# Patient Record
Sex: Male | Born: 1975 | Race: Black or African American | Hispanic: No | Marital: Single | Smoking: Current every day smoker
Health system: Southern US, Community
[De-identification: ages and names within clinical notes are randomized; demographics above are authoritative.]

## PROBLEM LIST (undated history)

## (undated) DIAGNOSIS — J45909 Unspecified asthma, uncomplicated: Secondary | ICD-10-CM

---

## 2000-01-17 ENCOUNTER — Emergency Department (HOSPITAL_COMMUNITY): Admission: EM | Admit: 2000-01-17 | Discharge: 2000-01-17 | Payer: Self-pay | Admitting: Emergency Medicine

## 2000-03-14 ENCOUNTER — Encounter: Payer: Self-pay | Admitting: Emergency Medicine

## 2000-03-14 ENCOUNTER — Emergency Department (HOSPITAL_COMMUNITY): Admission: EM | Admit: 2000-03-14 | Discharge: 2000-03-14 | Payer: Self-pay | Admitting: Emergency Medicine

## 2001-12-24 ENCOUNTER — Emergency Department (HOSPITAL_COMMUNITY): Admission: EM | Admit: 2001-12-24 | Discharge: 2001-12-24 | Payer: Self-pay

## 2001-12-24 ENCOUNTER — Encounter: Payer: Self-pay | Admitting: Emergency Medicine

## 2002-09-27 ENCOUNTER — Emergency Department (HOSPITAL_COMMUNITY): Admission: EM | Admit: 2002-09-27 | Discharge: 2002-09-27 | Payer: Self-pay | Admitting: Podiatry

## 2003-03-10 ENCOUNTER — Emergency Department (HOSPITAL_COMMUNITY): Admission: EM | Admit: 2003-03-10 | Discharge: 2003-03-10 | Payer: Self-pay | Admitting: Emergency Medicine

## 2003-06-18 ENCOUNTER — Emergency Department (HOSPITAL_COMMUNITY): Admission: EM | Admit: 2003-06-18 | Discharge: 2003-06-18 | Payer: Self-pay | Admitting: Emergency Medicine

## 2003-06-22 ENCOUNTER — Emergency Department (HOSPITAL_COMMUNITY): Admission: EM | Admit: 2003-06-22 | Discharge: 2003-06-22 | Payer: Self-pay | Admitting: Emergency Medicine

## 2003-06-23 ENCOUNTER — Emergency Department (HOSPITAL_COMMUNITY): Admission: EM | Admit: 2003-06-23 | Discharge: 2003-06-23 | Payer: Self-pay | Admitting: Emergency Medicine

## 2005-04-10 ENCOUNTER — Emergency Department (HOSPITAL_COMMUNITY): Admission: EM | Admit: 2005-04-10 | Discharge: 2005-04-10 | Payer: Self-pay | Admitting: Emergency Medicine

## 2006-01-17 ENCOUNTER — Emergency Department (HOSPITAL_COMMUNITY): Admission: EM | Admit: 2006-01-17 | Discharge: 2006-01-17 | Payer: Self-pay | Admitting: Emergency Medicine

## 2006-02-12 ENCOUNTER — Emergency Department (HOSPITAL_COMMUNITY): Admission: EM | Admit: 2006-02-12 | Discharge: 2006-02-13 | Payer: Self-pay | Admitting: Emergency Medicine

## 2007-09-22 ENCOUNTER — Emergency Department (HOSPITAL_COMMUNITY): Admission: EM | Admit: 2007-09-22 | Discharge: 2007-09-22 | Payer: Self-pay | Admitting: Emergency Medicine

## 2009-01-15 ENCOUNTER — Emergency Department (HOSPITAL_COMMUNITY): Admission: EM | Admit: 2009-01-15 | Discharge: 2009-01-15 | Payer: Self-pay | Admitting: Emergency Medicine

## 2009-10-05 ENCOUNTER — Emergency Department (HOSPITAL_COMMUNITY): Admission: EM | Admit: 2009-10-05 | Discharge: 2009-10-05 | Payer: Self-pay | Admitting: Emergency Medicine

## 2009-11-18 ENCOUNTER — Emergency Department (HOSPITAL_COMMUNITY): Admission: EM | Admit: 2009-11-18 | Discharge: 2009-11-18 | Payer: Self-pay | Admitting: Emergency Medicine

## 2010-01-07 ENCOUNTER — Emergency Department (HOSPITAL_COMMUNITY): Admission: EM | Admit: 2010-01-07 | Discharge: 2010-01-07 | Payer: Self-pay | Admitting: Family Medicine

## 2011-05-11 NOTE — H&P (Signed)
[facility]  Patient:    Jeremiah Curry                         MRN: [id number] Adm. Date:  [id number] Disc. Date: [id number] Attending:  Annamarie Dawley                         History and Physical  HISTORY OF PRESENT ILLNESS:  This patient is a 35 year old black male who has sustained an injury to his hand while dunking a basketball.  He got his hand caught on a chain link.  Subsequent to that, the patient noted a large laceration at the right index finger.  This is jagged, painful, and has fairly significant bleeding. The patient wrapped the finger and went to the emergency room.  We have seen him here.  I have been asked to see by the emergency room staff.  The patient has been given a tetanus shot.  PAST MEDICAL HISTORY:  None.  PAST SURGICAL HISTORY:  None.  MEDICATIONS:  None.  ALLERGIES:  None.  SOCIAL HISTORY:  Patient smokes a pack per day.  He does not drink.  PHYSICAL EXAMINATION:  Black male alert and oriented, in no acute distress. Patient has normal 2 point discrimination at the tip caliper measurement. There is normal capillary refill.  FDP and FDS in intact.  He has a very stellate and jagged laceration about the volar PIP region of the right index finger.  The remainder of the patients exam about the middle ring and small fingers, as well as the thumb is normal.  There is no postop bleeding at the present time.   X-rays are negative.  IMPRESSION:  Jagged, deep laceration to the right index finger.  PLAN:  I verbally consented him for I&D and repair of structures as necessary.   PROCEDURE:  The patient was given a digital block with 1% lidocaine by myself; his was done through the vincula sheath system.  After appropriate anesthesia, The patient underwent ______ surgical bath ______ , followed by sterile draping. Under 4.0 magnification, I explored the wound.  The patients ulnar digital nerve was exposed and noted  to be in the field.  It was intact.  The flexor tendon sheath was not encroached upon and the FDP and FDS were intact.  The radial digitial nerve artery were intact as well.  The patient underwent an excisional debridement, copious irrigation, and closure of the wound with interrupted 4-0 Prolene, as well as 4-0-0 chromic suture.  He tolerated this well.  Following this, sterile dressing was placed.   Tourniquet was deflated.  Tourniquet time was very brief. Excellent refill was noted.  The patient had a compressive dressing with a volar splint placed.  Excellent refill was noted at the time of discharge.  He tolerated the pro well without difficulty.  All counts were correct.  There were no complications. will have him seen in the office in 10-11 days.  He will remove his dressing in  five days and place a Band-Aid over it.  It has been a pleasure to see him.  He was given Keflex, as well as Vicodin.  He will take Keflex 500 mg 1 p.o. q.i.d. x 7  days and Vicodin 1-2 q. 4-6 p.r.n. pain p.o. dispensed #20, no refill.   If there are any problems, questions, or concerns, he will notify me. DD:  03/14/00 TD:  03/14/00 Job: [id number]

## 2013-02-13 ENCOUNTER — Encounter (HOSPITAL_COMMUNITY): Payer: Self-pay | Admitting: Cardiology

## 2013-02-13 ENCOUNTER — Emergency Department (HOSPITAL_COMMUNITY): Payer: No Typology Code available for payment source

## 2013-02-13 ENCOUNTER — Emergency Department (HOSPITAL_COMMUNITY)
Admission: EM | Admit: 2013-02-13 | Discharge: 2013-02-13 | Disposition: A | Discharge status: Home / Self Care | Payer: No Typology Code available for payment source | Attending: Emergency Medicine | Admitting: Emergency Medicine

## 2013-02-13 DIAGNOSIS — S51009A Unspecified open wound of unspecified elbow, initial encounter: Secondary | ICD-10-CM | POA: Insufficient documentation

## 2013-02-13 DIAGNOSIS — Y9389 Activity, other specified: Secondary | ICD-10-CM | POA: Insufficient documentation

## 2013-02-13 DIAGNOSIS — F17200 Nicotine dependence, unspecified, uncomplicated: Secondary | ICD-10-CM | POA: Insufficient documentation

## 2013-02-13 DIAGNOSIS — S51809A Unspecified open wound of unspecified forearm, initial encounter: Secondary | ICD-10-CM | POA: Insufficient documentation

## 2013-02-13 DIAGNOSIS — S239XXA Sprain of unspecified parts of thorax, initial encounter: Secondary | ICD-10-CM | POA: Insufficient documentation

## 2013-02-13 DIAGNOSIS — S29019A Strain of muscle and tendon of unspecified wall of thorax, initial encounter: Secondary | ICD-10-CM

## 2013-02-13 DIAGNOSIS — S161XXA Strain of muscle, fascia and tendon at neck level, initial encounter: Secondary | ICD-10-CM

## 2013-02-13 DIAGNOSIS — M549 Dorsalgia, unspecified: Secondary | ICD-10-CM

## 2013-02-13 DIAGNOSIS — Y92410 Unspecified street and highway as the place of occurrence of the external cause: Secondary | ICD-10-CM | POA: Insufficient documentation

## 2013-02-13 DIAGNOSIS — S46909A Unspecified injury of unspecified muscle, fascia and tendon at shoulder and upper arm level, unspecified arm, initial encounter: Secondary | ICD-10-CM | POA: Insufficient documentation

## 2013-02-13 DIAGNOSIS — S139XXA Sprain of joints and ligaments of unspecified parts of neck, initial encounter: Secondary | ICD-10-CM | POA: Insufficient documentation

## 2013-02-13 DIAGNOSIS — S4980XA Other specified injuries of shoulder and upper arm, unspecified arm, initial encounter: Secondary | ICD-10-CM | POA: Insufficient documentation

## 2013-02-13 DIAGNOSIS — Z23 Encounter for immunization: Secondary | ICD-10-CM | POA: Insufficient documentation

## 2013-02-13 DIAGNOSIS — S298XXA Other specified injuries of thorax, initial encounter: Secondary | ICD-10-CM | POA: Insufficient documentation

## 2013-02-13 MED ORDER — TETANUS-DIPHTH-ACELL PERTUSSIS 5-2.5-18.5 LF-MCG/0.5 IM SUSP
0.5000 mL | Freq: Once | INTRAMUSCULAR | Status: AC
  Administered 2013-02-13: 0.5 mL via INTRAMUSCULAR
  Filled 2013-02-13: qty 0.5

## 2013-02-13 MED ORDER — DIAZEPAM 5 MG PO TABS
10.0000 mg | ORAL_TABLET | Freq: Once | ORAL | Status: AC
  Administered 2013-02-13: 10 mg via ORAL
  Filled 2013-02-13: qty 2

## 2013-02-13 MED ORDER — NAPROXEN 500 MG PO TABS: 500.0000 mg | ORAL_TABLET | Freq: Two times a day (BID) | ORAL | Status: DC | PRN

## 2013-02-13 MED ORDER — HYDROCODONE-ACETAMINOPHEN 5-325 MG PO TABS: 1.0000 | ORAL_TABLET | Freq: Four times a day (QID) | ORAL | Status: DC | PRN

## 2013-02-13 MED ORDER — HYDROCODONE-ACETAMINOPHEN 5-325 MG PO TABS
2.0000 | ORAL_TABLET | Freq: Once | ORAL | Status: AC
  Administered 2013-02-13: 2 via ORAL
  Filled 2013-02-13: qty 2

## 2013-02-13 MED ORDER — METHOCARBAMOL 750 MG PO TABS: 750.0000 mg | ORAL_TABLET | Freq: Four times a day (QID) | ORAL | Status: DC | PRN
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
## 2013-02-13 NOTE — ED Notes (Signed)
Patient transported to X-ray 

## 2013-02-14 NOTE — ED Provider Notes (Signed)
Medical screening examination/treatment/procedure(s) were performed by non-physician practitioner and as supervising physician I was immediately available for consultation/collaboration.   [name], MD 02/14/13 [phone]

## 2013-04-08 ENCOUNTER — Emergency Department (HOSPITAL_COMMUNITY)
Admission: EM | Admit: 2013-04-08 | Discharge: 2013-04-09 | Disposition: A | Discharge status: Home / Self Care | Payer: Self-pay | Attending: Emergency Medicine | Admitting: Emergency Medicine

## 2013-04-08 ENCOUNTER — Encounter (HOSPITAL_COMMUNITY): Payer: Self-pay | Admitting: Emergency Medicine

## 2013-04-08 DIAGNOSIS — R300 Dysuria: Secondary | ICD-10-CM | POA: Insufficient documentation

## 2013-04-08 DIAGNOSIS — Z7251 High risk heterosexual behavior: Secondary | ICD-10-CM | POA: Insufficient documentation

## 2013-04-08 DIAGNOSIS — A5619 Other chlamydial genitourinary infection: Secondary | ICD-10-CM | POA: Insufficient documentation

## 2013-04-08 DIAGNOSIS — A64 Unspecified sexually transmitted disease: Secondary | ICD-10-CM

## 2013-04-08 DIAGNOSIS — F17200 Nicotine dependence, unspecified, uncomplicated: Secondary | ICD-10-CM | POA: Insufficient documentation

## 2013-04-08 DIAGNOSIS — I10 Essential (primary) hypertension: Secondary | ICD-10-CM | POA: Insufficient documentation

## 2013-04-08 NOTE — ED Notes (Signed)
PT. REPORTS PENILE DISCHARGE WITH DYSURIA FOR 2 DAYS , DENIES HEMATURIA.

## 2013-04-09 LAB — URINALYSIS, ROUTINE W REFLEX MICROSCOPIC
Glucose, UA: NEGATIVE mg/dL
Specific Gravity, Urine: 1.017 (ref 1.005–1.030)
pH: 5.5 (ref 5.0–8.0)

## 2013-04-09 LAB — URINE MICROSCOPIC-ADD ON

## 2013-04-09 MED ORDER — AZITHROMYCIN 250 MG PO TABS
1000.0000 mg | ORAL_TABLET | Freq: Once | ORAL | Status: AC
  Administered 2013-04-09: 1000 mg via ORAL
  Filled 2013-04-09: qty 4

## 2013-04-09 MED ORDER — LIDOCAINE HCL (PF) 1 % IJ SOLN
INTRAMUSCULAR | Status: AC
  Administered 2013-04-09: 5 mL
  Filled 2013-04-09: qty 5

## 2013-04-09 MED ORDER — CEFTRIAXONE SODIUM 250 MG IJ SOLR
250.0000 mg | Freq: Once | INTRAMUSCULAR | Status: AC
  Administered 2013-04-09: 250 mg via INTRAMUSCULAR
  Filled 2013-04-09: qty 250

## 2013-04-09 NOTE — ED Provider Notes (Signed)
Medical screening examination/treatment/procedure(s) were performed by non-physician practitioner and as supervising physician I was immediately available for consultation/collaboration.  Kiaraliz Rafuse M Iverna Hammac, MD 04/09/13 0644 

## 2013-04-09 NOTE — ED Provider Notes (Signed)
History     CSN: [id number]  Arrival date & time 04/08/13  2140   First MD Initiated Contact with Patient 04/09/13 0020      Chief Complaint  Patient presents with  . Penile Discharge    (Consider location/radiation/quality/duration/timing/severity/associated sxs/prior treatment) Patient is a 37 y.o. male presenting with penile discharge. The history is provided by the patient.  Penile Discharge The current episode started yesterday. The problem occurs intermittently. The problem has been gradually worsening. Pertinent negatives include no abdominal pain, anorexia, arthralgias, change in bowel habit, chest pain, chills, congestion, coughing, diaphoresis, fatigue, fever, headaches, joint swelling, myalgias, nausea, neck pain, numbness, rash, sore throat, swollen glands, urinary symptoms, vertigo, visual change, vomiting or weakness. Nothing aggravates the symptoms. He has tried nothing for the symptoms.    History reviewed. No pertinent past medical history.  History reviewed. No pertinent past surgical history.  No family history on file.  History  Substance Use Topics  . Smoking status: Current Every Day Smoker  . Smokeless tobacco: Not on file  . Alcohol Use: Yes      Review of Systems  Constitutional: Negative for fever, chills, diaphoresis and fatigue.  HENT: Negative for congestion, sore throat and neck pain.   Respiratory: Negative for cough.   Cardiovascular: Negative for chest pain.  Gastrointestinal: Negative for nausea, vomiting, abdominal pain, anorexia and change in bowel habit.  Genitourinary: Positive for dysuria and discharge. Negative for urgency, frequency, flank pain, penile swelling, scrotal swelling, difficulty urinating, genital sores, penile pain and testicular pain.  Musculoskeletal: Negative for myalgias, joint swelling and arthralgias.  Skin: Negative for rash.  Neurological: Negative for vertigo, weakness, numbness and headaches.  All other  systems reviewed and are negative.    Allergies  Review of patient's allergies indicates no known allergies.  Home Medications  No current outpatient prescriptions on file.  BP 185/107  Pulse 88  Temp(Src) 98.9 F (37.2 C) (Oral)  Resp 14  SpO2 99%  Physical Exam  Nursing note and vitals reviewed. Constitutional: He is oriented to person, place, and time. He appears well-developed and well-nourished. No distress.  HENT:  Head: Normocephalic and atraumatic.  Eyes: Conjunctivae and EOM are normal.  Neck: Normal range of motion.  Pulmonary/Chest: Effort normal.  Genitourinary:  Exam chaperoned.  Normal circumcised penis and scrotum.  Nontender without genital lesions or sores.  Mild penile discharge present.  GC Chlamydia cultures obtained  Musculoskeletal: Normal range of motion.  Neurological: He is alert and oriented to person, place, and time.  Skin: Skin is warm and dry. No rash noted. He is not diaphoretic.  Psychiatric: He has a normal mood and affect. His behavior is normal.    ED Course  Procedures (including critical care time)  Labs Reviewed  URINALYSIS, ROUTINE W REFLEX MICROSCOPIC - Abnormal; Notable for the following:    APPearance CLOUDY (*)    Hgb urine dipstick TRACE (*)    Leukocytes, UA MODERATE (*)    All other components within normal limits  URINE MICROSCOPIC-ADD ON - Abnormal; Notable for the following:    Bacteria, UA FEW (*)    All other components within normal limits  GC/CHLAMYDIA PROBE AMP  URINE CULTURE   No results found.   No diagnosis found.    MDM  Patient to be discharged with instructions to follow up with health department for further testing.  Discussed high blood pressure.  Discussed importance of using protection when sexually active. Pt understands that they have GC/Chlamydia cultures  pending and that they will need to inform all sexual partners if results return positive. Pt has been treated prophylacticly with azithromycin  and rocephin due to pts history, exam and.  Jaci Carrel, PA-C 04/09/13 0111

## 2013-04-10 LAB — URINE CULTURE: Culture: NO GROWTH

## 2013-04-11 LAB — GC/CHLAMYDIA PROBE AMP
CT Probe RNA: NEGATIVE
GC Probe RNA: POSITIVE — AB

## 2013-04-12 ENCOUNTER — Telehealth (HOSPITAL_COMMUNITY): Payer: Self-pay | Admitting: Emergency Medicine

## 2013-04-12 NOTE — ED Notes (Signed)
[employer]chant [employer] officer unavailable.

## 2013-04-12 NOTE — ED Notes (Signed)
Patient has +Gonorrhea. °

## 2013-04-13 ENCOUNTER — Telehealth (HOSPITAL_COMMUNITY): Payer: Self-pay | Admitting: Emergency Medicine

## 2014-10-21 ENCOUNTER — Encounter (HOSPITAL_COMMUNITY): Payer: Self-pay | Admitting: Emergency Medicine

## 2014-10-21 ENCOUNTER — Emergency Department (HOSPITAL_COMMUNITY): Payer: No Typology Code available for payment source

## 2014-10-21 ENCOUNTER — Emergency Department (HOSPITAL_COMMUNITY)
Admission: EM | Admit: 2014-10-21 | Discharge: 2014-10-21 | Disposition: A | Discharge status: Home / Self Care | Payer: No Typology Code available for payment source | Attending: Emergency Medicine | Admitting: Emergency Medicine

## 2014-10-21 DIAGNOSIS — S40012A Contusion of left shoulder, initial encounter: Secondary | ICD-10-CM | POA: Diagnosis not present

## 2014-10-21 DIAGNOSIS — S40021A Contusion of right upper arm, initial encounter: Secondary | ICD-10-CM

## 2014-10-21 DIAGNOSIS — Y92410 Unspecified street and highway as the place of occurrence of the external cause: Secondary | ICD-10-CM | POA: Insufficient documentation

## 2014-10-21 DIAGNOSIS — IMO0001 Reserved for inherently not codable concepts without codable children: Secondary | ICD-10-CM

## 2014-10-21 DIAGNOSIS — S3992XA Unspecified injury of lower back, initial encounter: Secondary | ICD-10-CM | POA: Diagnosis not present

## 2014-10-21 DIAGNOSIS — S5002XA Contusion of left elbow, initial encounter: Secondary | ICD-10-CM | POA: Insufficient documentation

## 2014-10-21 DIAGNOSIS — Z720 Tobacco use: Secondary | ICD-10-CM | POA: Diagnosis not present

## 2014-10-21 DIAGNOSIS — S40011A Contusion of right shoulder, initial encounter: Secondary | ICD-10-CM

## 2014-10-21 DIAGNOSIS — R52 Pain, unspecified: Secondary | ICD-10-CM

## 2014-10-21 DIAGNOSIS — S4992XA Unspecified injury of left shoulder and upper arm, initial encounter: Secondary | ICD-10-CM | POA: Diagnosis present

## 2014-10-21 DIAGNOSIS — Y9389 Activity, other specified: Secondary | ICD-10-CM | POA: Insufficient documentation

## 2014-10-21 DIAGNOSIS — J45909 Unspecified asthma, uncomplicated: Secondary | ICD-10-CM | POA: Insufficient documentation

## 2014-10-21 HISTORY — DX: Unspecified asthma, uncomplicated: J45.909

## 2014-10-21 MED ORDER — HYDROCODONE-ACETAMINOPHEN 5-325 MG PO TABS: 2.0000 | ORAL_TABLET | ORAL | Status: DC | PRN

## 2014-10-21 MED ORDER — HYDROCODONE-ACETAMINOPHEN 5-325 MG PO TABS
2.0000 | ORAL_TABLET | Freq: Once | ORAL | Status: AC
  Administered 2014-10-21: 2 via ORAL
  Filled 2014-10-21: qty 2

## 2014-10-21 MED ORDER — IBUPROFEN 800 MG PO TABS: 800.0000 mg | ORAL_TABLET | Freq: Three times a day (TID) | ORAL | Status: DC
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
## 2014-10-21 NOTE — Discharge Instructions (Signed)

## 2014-10-21 NOTE — ED Notes (Signed)
Patient transported to X-ray 

## 2014-10-21 NOTE — ED Notes (Signed)
PA at bedside.
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
## 2014-10-21 NOTE — ED Provider Notes (Signed)
Medical screening examination/treatment/procedure(s) were performed by non-physician practitioner and as supervising physician I was immediately available for consultation/collaboration.  Aubry Tucholski L Shavaun Osterloh, MD 10/21/14 1701 

## 2014-11-19 ENCOUNTER — Emergency Department (HOSPITAL_COMMUNITY): Payer: Self-pay

## 2014-11-19 ENCOUNTER — Emergency Department (HOSPITAL_COMMUNITY)
Admission: EM | Admit: 2014-11-19 | Discharge: 2014-11-19 | Disposition: A | Discharge status: Home / Self Care | Payer: Self-pay | Attending: Emergency Medicine | Admitting: Emergency Medicine

## 2014-11-19 ENCOUNTER — Encounter (HOSPITAL_COMMUNITY): Payer: Self-pay | Admitting: *Deleted

## 2014-11-19 DIAGNOSIS — Z720 Tobacco use: Secondary | ICD-10-CM | POA: Insufficient documentation

## 2014-11-19 DIAGNOSIS — Y998 Other external cause status: Secondary | ICD-10-CM | POA: Insufficient documentation

## 2014-11-19 DIAGNOSIS — W1839XA Other fall on same level, initial encounter: Secondary | ICD-10-CM | POA: Insufficient documentation

## 2014-11-19 DIAGNOSIS — S52615A Nondisplaced fracture of left ulna styloid process, initial encounter for closed fracture: Secondary | ICD-10-CM | POA: Insufficient documentation

## 2014-11-19 DIAGNOSIS — J45909 Unspecified asthma, uncomplicated: Secondary | ICD-10-CM | POA: Insufficient documentation

## 2014-11-19 DIAGNOSIS — S52612A Displaced fracture of left ulna styloid process, initial encounter for closed fracture: Secondary | ICD-10-CM

## 2014-11-19 DIAGNOSIS — Y9367 Activity, basketball: Secondary | ICD-10-CM | POA: Insufficient documentation

## 2014-11-19 DIAGNOSIS — Z791 Long term (current) use of non-steroidal anti-inflammatories (NSAID): Secondary | ICD-10-CM | POA: Insufficient documentation

## 2014-11-19 DIAGNOSIS — Y9289 Other specified places as the place of occurrence of the external cause: Secondary | ICD-10-CM | POA: Insufficient documentation

## 2014-11-19 DIAGNOSIS — S52502A Unspecified fracture of the lower end of left radius, initial encounter for closed fracture: Secondary | ICD-10-CM | POA: Insufficient documentation

## 2014-11-19 MED ORDER — OXYCODONE-ACETAMINOPHEN 5-325 MG PO TABS
2.0000 | ORAL_TABLET | Freq: Once | ORAL | Status: AC
  Administered 2014-11-19: 2 via ORAL
  Filled 2014-11-19: qty 2

## 2014-11-19 MED ORDER — HYDROMORPHONE HCL 1 MG/ML IJ SOLN
1.0000 mg | Freq: Once | INTRAMUSCULAR | Status: AC
  Administered 2014-11-19: 1 mg via INTRAMUSCULAR
  Filled 2014-11-19: qty 1

## 2014-11-19 MED ORDER — OXYCODONE-ACETAMINOPHEN 5-325 MG PO TABS: 2.0000 | ORAL_TABLET | Freq: Four times a day (QID) | ORAL | Status: DC | PRN

## 2014-11-19 NOTE — Progress Notes (Signed)
Orthopedic Tech Progress Note Patient Details:  Jake BatheRicky L Bartram Mar 20, 1976 [id number] Applied fiberglass sugar tong splint to LUE.  Pulses, sensation, motion intact before and after application.  Capillary refill less than 2 seconds before and after application.  Placed LUE in arm sling. Ortho Devices Type of Ortho Device: Sugartong splint   Lesle ChrisGilliland, Jalise Zawistowski L 11/19/2014, 11:18 PM

## 2014-11-19 NOTE — Discharge Instructions (Signed)
Wrist Fracture A wrist fracture is a break or crack in one of the bones of your wrist. Your wrist is made up of eight small bones at the palm of your hand (carpal bones) and two long bones that make up your forearm (radius and ulna).  CAUSES   A direct blow to the wrist.  Falling on an outstretched hand.  Trauma, such as a car accident or a fall. RISK FACTORS Risk factors for wrist fracture include:   Participating in contact and high-risk sports, such as skiing, biking, and ice skating.  Taking steroid medicines.  Smoking.  Being male.  Being Caucasian.  Drinking more than three alcoholic beverages per day.  Having low or lowered bone density (osteoporosis or osteopenia).  Age. Older adults have decreased bone density.  Women who have had menopause.  History of previous fractures. SIGNS AND SYMPTOMS Symptoms of wrist fractures include tenderness, bruising, and inflammation. Additionally, the wrist may hang in an odd position or appear deformed.  DIAGNOSIS Diagnosis may include:  Physical exam.  X-ray. TREATMENT Treatment depends on many factors, including the nature and location of the fracture, your age, and your activity level. Treatment for wrist fracture can be nonsurgical or surgical.  Nonsurgical Treatment A plaster cast or splint may be applied to your wrist if the bone is in a good position. If the fracture is not in good position, it may be necessary for your health care provider to realign it before applying a splint or cast. Usually, a cast or splint will be worn for several weeks.  Surgical Treatment Sometimes the position of the bone is so far out of place that surgery is required to apply a device to hold it together as it heals. Depending on the fracture, there are a number of options for holding the bone in place while it heals, such as a cast and metal pins.  HOME CARE INSTRUCTIONS  Keep your injured wrist elevated and move your fingers as much as  possible.  Do not put pressure on any part of your cast or splint. It may break.   Use a plastic bag to protect your cast or splint from water while bathing or showering. Do not lower your cast or splint into water.  Take medicines only as directed by your health care provider.  Keep your cast or splint clean and dry. If it becomes wet, damaged, or suddenly feels too tight, contact your health care provider right away.  Do not use any tobacco products including cigarettes, chewing tobacco, or electronic cigarettes. Tobacco can delay bone healing. If you need help quitting, ask your health care provider.  Keep all follow-up visits as directed by your health care provider. This is important.  Ask your health care provider if you should take supplements of calcium and vitamins C and D to promote bone healing. SEEK MEDICAL CARE IF:   Your cast or splint is damaged, breaks, or gets wet.  You have a fever.  You have chills.  You have continued severe pain or more swelling than you did before the cast was put on. SEEK IMMEDIATE MEDICAL CARE IF:   Your hand or fingernails on the injured arm turn blue or gray, or feel cold or numb.  You have decreased feeling in the fingers of your injured arm. MAKE SURE YOU:  Understand these instructions.  Will watch your condition.  Will get help right away if you are not doing well or get worse. Document Released: 09/19/2005 Document Revised:   04/26/2014 Document Reviewed: 12/28/2011 Exit[employer] Patient Information 2015 [employer]. This information is not intended to replace advice given to you by your health care provider. Make sure you discuss any questions you have with your health care provider.  

## 2014-11-19 NOTE — ED Notes (Addendum)
Pt in c/o left wrist injury after a fall PTA, deformity and swelling noted, CMS intact

## 2014-11-19 NOTE — ED Provider Notes (Signed)
CSN: [id number]     Arrival date & time 11/19/14  2005 History   First MD Initiated Contact with Patient 11/19/14 2010     Chief Complaint  Patient presents with  . Wrist Injury     (Consider location/radiation/quality/duration/timing/severity/associated sxs/prior Treatment) HPI Comments: Patient presents to the emergency department with chief complaint of left arm pain. He states that he suffered a fall on an outstretched hand while playing basketball proximately one hour ago. Complains of 10 out of 10 left wrist pain.  The pain is worsened with movement and palpation. He has not taken anything to alleviate his symptoms.  The history is provided by the patient. No language interpreter was used.    Past Medical History  Diagnosis Date  . Asthma    History reviewed. No pertinent past surgical history. History reviewed. No pertinent family history. History  Substance Use Topics  . Smoking status: Current Every Day Smoker -- 0.50 packs/day  . Smokeless tobacco: Not on file  . Alcohol Use: 1.2 oz/week    2 Cans of beer per week     Comment: every other day    Review of Systems  Constitutional: Negative for fever and chills.  Respiratory: Negative for shortness of breath.   Cardiovascular: Negative for chest pain.  Gastrointestinal: Negative for nausea, vomiting, diarrhea and constipation.  Genitourinary: Negative for dysuria.  Musculoskeletal: Positive for joint swelling and arthralgias.  All other systems reviewed and are negative.     Allergies  Review of patient's allergies indicates no known allergies.  Home Medications   Prior to Admission medications   Medication Sig Start Date End Date Taking? Authorizing Provider  ibuprofen (ADVIL,MOTRIN) 800 MG tablet Take 1 tablet (800 mg total) by mouth 3 (three) times daily. 10/21/14  Yes Leslie K Sofia, PA-C   BP 149/79 mmHg  Pulse 87  Temp(Src) 98 F (36.7 C) (Oral)  Resp 22  SpO2 99% Physical Exam  Constitutional:  He is oriented to person, place, and time. He appears well-developed and well-nourished.  HENT:  Head: Normocephalic and atraumatic.  Eyes: Conjunctivae and EOM are normal.  Neck: Normal range of motion.  Cardiovascular: Normal rate and intact distal pulses.   Intact distal pulses Brisk capillary refill  Pulmonary/Chest: Effort normal.  Abdominal: He exhibits no distension.  Musculoskeletal: Normal range of motion.  Left wrist remarkable for obvious bony deformity, range of motion and strength of the wrist is limited secondary to pain, range of motion and strength of the fingers are limited by pain  Neurological: He is alert and oriented to person, place, and time.  Sensation intact  Skin: Skin is dry.  Skin is intact  Psychiatric: He has a normal mood and affect. His behavior is normal. Judgment and thought content normal.  Nursing note and vitals reviewed.   ED Course  Procedures (including critical care time) Labs Review Labs Reviewed - No data to display  Imaging Review Dg Wrist Complete Left  11/19/2014   CLINICAL DATA:  Fall, basketball injury, left wrist pain  EXAM: LEFT WRIST - COMPLETE 3+ VIEW  COMPARISON:  None.  FINDINGS: Nondisplaced fracture involving the dorsal aspect of the distal radius. Suspected intra-articular extension.  Ulnar styloid is mildly irregular, possibly reflecting an additional nondisplaced fracture.  Associated dorsal soft tissue swelling.  IMPRESSION: Nondisplaced distal radial fracture.  Possible nondisplaced ulnar styloid fracture.   Electronically Signed   By: Charline BillsSriyesh  Krishnan M.D.   On: 11/19/2014 21:55     EKG Interpretation None  SPLINT APPLICATION Date/Time: 11:02 PM Authorized by: Roxy HorsemanBROWNING, Damaree Sargent Consent: Verbal consent obtained. Risks and benefits: risks, benefits and alternatives were discussed Consent given by: patient Splint applied by: orthopedic technician Location details: left wrist Splint type: sugar tong Supplies  used: fiberglass and sling Post-procedure: The splinted body part was neurovascularly unchanged following the procedure. Patient tolerance: Patient tolerated the procedure well with no immediate complications.    MDM   Final diagnoses:  Distal radius fracture, left, closed, initial encounter  Fracture of ulnar styloid, left, closed, initial encounter    Patient with probable closed fracture of left wrist, will treat pain, and order x-ray.  Imaging as above. Patient placed in sugar tong splint. Hand surgery follow-up advised. Will discharge with pain medicine. Reassessed after splinting, distal sensation and movement is intact. Patient is stable and ready for discharge.   Roxy HorsemanRobert Michaelann Gunnoe, PA-C 11/19/14 2303  Flint MelterElliott L Wentz, MD 11/19/14 [phone]

## 2015-01-26 ENCOUNTER — Other Ambulatory Visit (HOSPITAL_COMMUNITY)
Admission: RE | Admit: 2015-01-26 | Discharge: 2015-01-26 | Disposition: A | Discharge status: Home / Self Care | Payer: Self-pay | Source: Ambulatory Visit | Attending: Family Medicine | Admitting: Family Medicine

## 2015-01-26 ENCOUNTER — Encounter (HOSPITAL_COMMUNITY): Payer: Self-pay | Admitting: Emergency Medicine

## 2015-01-26 ENCOUNTER — Emergency Department (INDEPENDENT_AMBULATORY_CARE_PROVIDER_SITE_OTHER)
Admission: EM | Admit: 2015-01-26 | Discharge: 2015-01-26 | Disposition: A | Discharge status: Home / Self Care | Payer: Self-pay | Source: Home / Self Care | Attending: Family Medicine | Admitting: Family Medicine

## 2015-01-26 DIAGNOSIS — Z113 Encounter for screening for infections with a predominantly sexual mode of transmission: Secondary | ICD-10-CM | POA: Insufficient documentation

## 2015-01-26 DIAGNOSIS — Z711 Person with feared health complaint in whom no diagnosis is made: Secondary | ICD-10-CM

## 2015-01-26 MED ORDER — AZITHROMYCIN 250 MG PO TABS
ORAL_TABLET | ORAL | Status: AC
  Filled 2015-01-26: qty 4

## 2015-01-26 MED ORDER — CEFTRIAXONE SODIUM 250 MG IJ SOLR
INTRAMUSCULAR | Status: AC
  Filled 2015-01-26: qty 250

## 2015-01-26 MED ORDER — AZITHROMYCIN 250 MG PO TABS
1000.0000 mg | ORAL_TABLET | Freq: Once | ORAL | Status: AC
  Administered 2015-01-26: 1000 mg via ORAL

## 2015-01-26 MED ORDER — CEFTRIAXONE SODIUM 250 MG IJ SOLR
250.0000 mg | Freq: Once | INTRAMUSCULAR | Status: AC
  Administered 2015-01-26: 250 mg via INTRAMUSCULAR

## 2015-01-26 MED ORDER — LIDOCAINE HCL (PF) 1 % IJ SOLN
INTRAMUSCULAR | Status: AC
  Filled 2015-01-26: qty 5

## 2015-01-26 NOTE — Discharge Instructions (Signed)
We will call with  Positive test results and treat as indicated

## 2015-01-26 NOTE — ED Provider Notes (Signed)
CSN: [id number]     Arrival date & time 01/26/15  1649 History   First MD Initiated Contact with Patient 01/26/15 1728     No chief complaint on file.  (Consider location/radiation/quality/duration/timing/severity/associated sxs/prior Treatment) Patient is a 39 y.o. male presenting with STD exposure. The history is provided by the patient.  Exposure to STD This is a new problem. The current episode started yesterday (told yest by girl that she had chlamydia, pt with no sx, and no h/o std.). Pertinent negatives include no chest pain and no abdominal pain.    Past Medical History  Diagnosis Date  . Asthma    No past surgical history on file. No family history on file. History  Substance Use Topics  . Smoking status: Current Every Day Smoker -- 0.50 packs/day  . Smokeless tobacco: Not on file  . Alcohol Use: 1.2 oz/week    2 Cans of beer per week     Comment: every other day    Review of Systems  Constitutional: Negative.   Cardiovascular: Negative for chest pain.  Gastrointestinal: Negative for abdominal pain.  Genitourinary: Negative.  Negative for discharge, penile swelling, genital sores, penile pain and testicular pain.  Hematological: Negative for adenopathy.    Allergies  Review of patient's allergies indicates no known allergies.  Home Medications   Prior to Admission medications   Medication Sig Start Date End Date Taking? Authorizing Provider  ibuprofen (ADVIL,MOTRIN) 800 MG tablet Take 1 tablet (800 mg total) by mouth 3 (three) times daily. 10/21/14   Elson AreasLeslie K Sofia, PA-C  oxyCODONE-acetaminophen (PERCOCET/ROXICET) 5-325 MG per tablet Take 2 tablets by mouth every 6 (six) hours as needed for severe pain. 11/19/14   Roxy Horsemanobert Browning, PA-C   BP 149/91 mmHg  Pulse 85  Temp(Src) 98.2 F (36.8 C) (Oral)  Resp 16  SpO2 100% Physical Exam  Constitutional: He is oriented to person, place, and time. He appears well-developed and well-nourished.  Abdominal: Soft. Bowel  sounds are normal.  Genitourinary: Testes normal and penis normal. Cremasteric reflex is present. Circumcised. No penile tenderness.  Lymphadenopathy:       Right: No inguinal adenopathy present.       Left: No inguinal adenopathy present.  Neurological: He is alert and oriented to person, place, and time.  Skin: Skin is warm and dry.  Nursing note and vitals reviewed.   ED Course  Procedures (including critical care time) Labs Review Labs Reviewed  CYTOLOGY, (ORAL, ANAL, URETHRAL) ANCILLARY ONLY    Imaging Review No results found.   MDM   1. Concern about STD in male without diagnosis        Linna HoffJames D Kindl, MD 01/26/15 1800

## 2015-01-26 NOTE — ED Notes (Signed)
Pt states that his partner told him she was diagnosed with Chlamydia. And he is here to get tested pt denies any symptoms of discharge

## 2015-01-27 LAB — CYTOLOGY, (ORAL, ANAL, URETHRAL) ANCILLARY ONLY
Chlamydia: POSITIVE — AB
NEISSERIA GONORRHEA: NEGATIVE

## 2015-02-02 ENCOUNTER — Telehealth (HOSPITAL_COMMUNITY): Payer: Self-pay | Admitting: *Deleted

## 2015-02-02 NOTE — ED Notes (Addendum)
GC neg., Chlamydia pos.  Pt. adequately treated with Zithromax and Rocephin.  I called pt. and left a message to call. Call 1.  [facility]. Vassie MoselleYork, Dung Salinger M 02/02/2015 I called pt. Pt. verified x 2 and given results.  Pt. Told he was adequately treated.  Pt. instructed to notify his partner, no sex for 1 week and to practice safe sex. Pt. told [facility] can [facility], by appointment.  Pt. voiced understanding. Vassie MoselleYork, Raynald Rouillard M 02/03/2015

## 2016-02-02 ENCOUNTER — Other Ambulatory Visit (HOSPITAL_COMMUNITY)
Admission: RE | Admit: 2016-02-02 | Discharge: 2016-02-02 | Disposition: A | Discharge status: Home / Self Care | Payer: Self-pay | Source: Ambulatory Visit | Attending: Family Medicine | Admitting: Family Medicine

## 2016-02-02 ENCOUNTER — Encounter (HOSPITAL_COMMUNITY): Payer: Self-pay | Admitting: Emergency Medicine

## 2016-02-02 ENCOUNTER — Emergency Department (INDEPENDENT_AMBULATORY_CARE_PROVIDER_SITE_OTHER)
Admission: EM | Admit: 2016-02-02 | Discharge: 2016-02-02 | Disposition: A | Discharge status: Home / Self Care | Payer: Self-pay | Source: Home / Self Care | Attending: Family Medicine | Admitting: Family Medicine

## 2016-02-02 DIAGNOSIS — Z113 Encounter for screening for infections with a predominantly sexual mode of transmission: Secondary | ICD-10-CM | POA: Insufficient documentation

## 2016-02-02 DIAGNOSIS — N451 Epididymitis: Secondary | ICD-10-CM

## 2016-02-02 LAB — POCT URINALYSIS DIP (DEVICE)
BILIRUBIN URINE: NEGATIVE
Glucose, UA: NEGATIVE mg/dL
KETONES UR: NEGATIVE mg/dL
LEUKOCYTES UA: NEGATIVE
Nitrite: NEGATIVE
PH: 6 (ref 5.0–8.0)
Protein, ur: 30 mg/dL — AB
Urobilinogen, UA: 0.2 mg/dL (ref 0.0–1.0)

## 2016-02-02 MED ORDER — DOXYCYCLINE HYCLATE 100 MG PO CAPS: 100.0000 mg | ORAL_CAPSULE | Freq: Two times a day (BID) | ORAL | Status: DC

## 2016-02-02 MED ORDER — IBUPROFEN 800 MG PO TABS: 800.0000 mg | ORAL_TABLET | Freq: Three times a day (TID) | ORAL | Status: DC

## 2016-02-02 NOTE — ED Notes (Signed)
Right groin pain/right lower abdominal pain.  Pain started yesterday, sudden onset.  Pain only moved a little more into abdomen since onset.  Patient reports burning with urination, "not like std"  Reports when pain started yesterday morning, had 4 loose stools, then none.

## 2016-02-02 NOTE — ED Provider Notes (Signed)
CSN: [id number]     Arrival date & time 02/02/16  1327 History   First MD Initiated Contact with Patient 02/02/16 1505     Chief Complaint  Patient presents with  . Groin Pain   (Consider location/radiation/quality/duration/timing/severity/associated sxs/prior Treatment) Patient is a 40 y.o. male presenting with groin pain. The history is provided by the patient. No language interpreter was used.  Groin Pain This is a new problem. The current episode started 2 days ago. The problem occurs constantly. The problem has been gradually worsening. Pertinent negatives include no chest pain and no abdominal pain. Nothing aggravates the symptoms. Nothing relieves the symptoms. He has tried nothing for the symptoms. The treatment provided moderate relief.    Past Medical History  Diagnosis Date  . Asthma    History reviewed. No pertinent past surgical history. No family history on file. Social History  Substance Use Topics  . Smoking status: Current Every Day Smoker -- 0.50 packs/day  . Smokeless tobacco: None  . Alcohol Use: 1.2 oz/week    2 Cans of beer per week     Comment: every other day    Review of Systems  Cardiovascular: Negative for chest pain.  Gastrointestinal: Negative for abdominal pain.  All other systems reviewed and are negative.   Allergies  Review of patient's allergies indicates no known allergies.  Home Medications   Prior to Admission medications   Medication Sig Start Date End Date Taking? Authorizing Provider  doxycycline (VIBRAMYCIN) 100 MG capsule Take 1 capsule (100 mg total) by mouth 2 (two) times daily. 02/02/16   Elson Areas, PA-C  ibuprofen (ADVIL,MOTRIN) 800 MG tablet Take 1 tablet (800 mg total) by mouth 3 (three) times daily. 02/02/16   Elson Areas, PA-C  oxyCODONE-acetaminophen (PERCOCET/ROXICET) 5-325 MG per tablet Take 2 tablets by mouth every 6 (six) hours as needed for severe pain. 11/19/14   Roxy Horseman, PA-C   Meds Ordered and  Administered this Visit  Medications - No data to display  BP 162/96 mmHg  Pulse 82  Temp(Src) 97.3 F (36.3 C) (Oral)  Resp 16  SpO2 99% No data found.   Physical Exam  Constitutional: He is oriented to person, place, and time. He appears well-developed and well-nourished.  HENT:  Head: Normocephalic.  Eyes: Pupils are equal, round, and reactive to light.  Neck: Normal range of motion.  Cardiovascular: Normal rate.   Pulmonary/Chest: Effort normal.  Abdominal: Soft.  Genitourinary:  Tender right testicle, pain to palpation, no mass, no swelling  Musculoskeletal: Normal range of motion.  Neurological: He is alert and oriented to person, place, and time. He has normal reflexes.  Skin: Skin is warm.  Psychiatric: He has a normal mood and affect.  Vitals reviewed.   ED Course  Procedures (including critical care time)  Labs Review Labs Reviewed  CERVICOVAGINAL ANCILLARY ONLY    Imaging Review No results found.   Visual Acuity Review  Right Eye Distance:   Left Eye Distance:   Bilateral Distance:    Right Eye Near:   Left Eye Near:    Bilateral Near:         MDM   1. Epididymitis    Meds ordered this encounter  Medications  . doxycycline (VIBRAMYCIN) 100 MG capsule    Sig: Take 1 capsule (100 mg total) by mouth 2 (two) times daily.    Dispense:  28 capsule    Refill:  0    Order Specific Question:  Supervising Provider  Answer:  Linna Hoff [[id number]]  . ibuprofen (ADVIL,MOTRIN) 800 MG tablet    Sig: Take 1 tablet (800 mg total) by mouth 3 (three) times daily.    Dispense:  21 tablet    Refill:  0    Order Specific Question:  Supervising Provider    Answer:  Linna Hoff [phone]   An After Visit Summary was printed and given to the patient.  Elson Areas, PA-C 02/02/16 1537 An After Visit Summary was printed and given to the patient.  Lonia Skinner Clyman, PA-C 02/02/16 1539

## 2016-02-02 NOTE — Discharge Instructions (Signed)
Epididymitis °Epididymitis is swelling (inflammation) of the epididymis. The epididymis is a cord-like structure that is located along the top and back part of the testicle. It collects and stores sperm from the testicle. °This condition can also cause pain and swelling of the testicle and scrotum. Symptoms usually start suddenly (acute epididymitis). Sometimes epididymitis starts gradually and lasts for a while (chronic epididymitis). This type may be harder to treat. °CAUSES °In men 35 and younger, this condition is usually caused by a bacterial infection or sexually transmitted disease (STD), such as: °· Gonorrhea. °· Chlamydia.   °In men 35 and older who do not have anal sex, this condition is usually caused by bacteria from a blockage or abnormalities in the urinary system. These can result from: °· Having a tube placed into the bladder (urinary catheter). °· Having an enlarged or inflamed prostate gland. °· Having recent urinary tract surgery. °In men who have a condition that weakens the body's defense system (immune system), such as HIV, this condition can be caused by:  °· Other bacteria, including tuberculosis and syphilis. °· Viruses. °· Fungi. °Sometimes this condition occurs without infection. That may happen if urine flows backward into the epididymis after heavy lifting or straining. °RISK FACTORS °This condition is more likely to develop in men: °· Who have unprotected sex with more than one partner. °· Who have anal sex.   °· Who have recently had surgery.   °· Who have a urinary catheter. °· Who have urinary problems. °· Who have a suppressed immune system.   °SYMPTOMS  °This condition usually begins suddenly with chills, fever, and pain behind the scrotum and in the testicle. Other symptoms include:  °· Swelling of the scrotum, testicle, or both. °· Pain when ejaculating or urinating. °· Pain in the back or belly. °· Nausea. °· Itching and discharge from the penis. °· Frequent need to pass  urine. °· Redness and tenderness of the scrotum. °DIAGNOSIS °Your health care provider can diagnose this condition based on your symptoms and medical history. Your health care provider will also do a physical exam to ask about your symptoms and check your scrotum and testicle for swelling, pain, and redness. You may also have other tests, including:   °· Examination of discharge from the penis. °· Urine tests for infections, such as STDs.   °Your health care provider may test you for other STDs, including HIV.  °TREATMENT °Treatment for this condition depends on the cause. If your condition is caused by a bacterial infection, oral antibiotic medicine may be prescribed. If the bacterial infection has spread to your blood, you may need to receive IV antibiotics. Nonbacterial epididymitis is treated with home care that includes bed rest and elevation of the scrotum. °Surgery may be needed to treat: °· Bacterial epididymitis that causes pus to build up in the scrotum (abscess). °· Chronic epididymitis that has not responded to other treatments. °HOME CARE INSTRUCTIONS °Medicines  °· Take over-the-counter and prescription medicines only as told by your health care provider.   °· If you were prescribed an antibiotic medicine, take it as told by your health care provider. Do not stop taking the antibiotic even if your condition improves. °Sexual Activity  °· If your epididymitis was caused by an STD, avoid sexual activity until your treatment is complete. °· Inform your sexual partner or partners if you test positive for an STD. They may need to be treated. Do not engage in sexual activity with your partner or partners until their treatment is completed. °General Instructions  °· Return to your normal activities as told   by your health care provider. Ask your health care provider what activities are safe for you. °· Keep your scrotum elevated and supported while resting. Ask your health care provider if you should wear a  scrotal support, such as a jockstrap. Wear it as told by your health care provider. °· If directed, apply ice to the affected area:   °¨ Put ice in a plastic bag. °¨ Place a towel between your skin and the bag. °¨ Leave the ice on for 20 minutes, 2-3 times per day. °· Try taking a sitz bath to help with discomfort. This is a warm water bath that is taken while you are sitting down. The water should only come up to your hips and should cover your buttocks. Do this 3-4 times per day or as told by your health care provider. °· Keep all follow-up visits as told by your health care provider. This is important. °SEEK MEDICAL CARE IF:  °· You have a fever.   °· Your pain medicine is not helping.   °· Your pain is getting worse.   °· Your symptoms do not improve within three days. °  °This information is not intended to replace advice given to you by your health care provider. Make sure you discuss any questions you have with your health care provider. °  °Document Released: 12/07/2000 Document Revised: 08/31/2015 Document Reviewed: 04/27/2015 °Elsevier Interactive Patient Education ©2016 [employer] ° °

## 2016-02-02 NOTE — ED Notes (Signed)
Patient requested a work note, verified with Langston Masker, pa

## 2016-02-03 LAB — CERVICOVAGINAL ANCILLARY ONLY
Chlamydia: NEGATIVE
Neisseria Gonorrhea: NEGATIVE

## 2016-02-10 ENCOUNTER — Telehealth (HOSPITAL_COMMUNITY): Payer: Self-pay | Admitting: Emergency Medicine

## 2016-02-10 NOTE — ED Notes (Signed)
Called pt and notified of recent lab results from visit 2/9 Pt ID'd properly... Reports feeling better and sx have subsided  Per Dr. Dayton Scrape,  Please let patient know that tests for gonorrhea/chlamydia were negative. Finish rx for doxycycline.  Recheck for persistent symptoms. LM  Adv pt if sx are not getting better to return  Education on safe sex given Pt verb understanding.

## 2017-09-28 ENCOUNTER — Ambulatory Visit (HOSPITAL_COMMUNITY)
Admission: EM | Admit: 2017-09-28 | Discharge: 2017-09-28 | Disposition: A | Discharge status: Home / Self Care | Payer: Self-pay | Attending: Family Medicine | Admitting: Family Medicine

## 2017-09-28 ENCOUNTER — Encounter (HOSPITAL_COMMUNITY): Payer: Self-pay

## 2017-09-28 DIAGNOSIS — H109 Unspecified conjunctivitis: Secondary | ICD-10-CM

## 2017-09-28 MED ORDER — FLUORESCEIN SODIUM 0.6 MG OP STRP
ORAL_STRIP | OPHTHALMIC | Status: AC
  Filled 2017-09-28: qty 1

## 2017-09-28 MED ORDER — TETRACAINE HCL 0.5 % OP SOLN
OPHTHALMIC | Status: AC
  Filled 2017-09-28: qty 4

## 2017-09-28 MED ORDER — OFLOXACIN 0.3 % OP SOLN: OPHTHALMIC | 0 refills | Status: DC

## 2017-09-28 MED ORDER — POLYETHYL GLYCOL-PROPYL GLYCOL 0.4-0.3 % OP GEL: 1.0000 "application " | Freq: Every evening | OPHTHALMIC | 0 refills | Status: DC | PRN

## 2017-09-28 NOTE — Discharge Instructions (Signed)
Use ofloxacin eyedrops as directed on both eyes. Artificial tear gel at night. Wait 10-15 minutes between drops, always use artificial tear gel last, as it prevents drops from penetrating through. Lid scrubs and warm compresses as directed. Monitor for any worsening of symptoms, changes in vision, sensitivity to light, eye swelling, follow up with ophthalmology for further evaluation.  ° °

## 2017-09-28 NOTE — ED Provider Notes (Signed)
[facility]    CSN: [id number] Arrival date & time: 09/28/17  1259     History   Chief Complaint Chief Complaint  Patient presents with  . Eye Drainage    HPI Jeremiah Curry is a 41 y.o. male.    41 year old with history of asthma comes in for 4 day history of right eye drainage, redness, photophobia. Symptoms has continued and has noticed some swelling around the right eye. Describes drainage in right eye as white thick discharge, and eye is crusted shut in the morning. Started noticing some discomfort in the left eye this morning, with mild discharge in the nasal corner of eye, but was not crusted shut. Blurry vision noticed in both eyes. States eyelid is sore to the touch. Mild foreign body sensation when blinking, which can be resolved with clear eye eye drop use.Marland Kitchen History of seasonal allergies, denies eye itchiness. No injury to the eye, foreign body. No contact lens use. Denies URI symptoms such as cough, congestion, sore throat, fever.      Past Medical History:  Diagnosis Date  . Asthma     There are no active problems to display for this patient.   History reviewed. No pertinent surgical history.     Home Medications    Prior to Admission medications   Medication Sig Start Date End Date Taking? Authorizing Provider  doxycycline (VIBRAMYCIN) 100 MG capsule Take 1 capsule (100 mg total) by mouth 2 (two) times daily. 02/02/16   Elson Areas, PA-C  ibuprofen (ADVIL,MOTRIN) 800 MG tablet Take 1 tablet (800 mg total) by mouth 3 (three) times daily. 02/02/16   Elson Areas, PA-C  ofloxacin (OCUFLOX) 0.3 % ophthalmic solution 1-2 drops in both eyes every 4 hours for the first 2 days, then 4 times a day for additional 5 days. 09/28/17   Cathie Hoops, Elani Delph V, PA-C  oxyCODONE-acetaminophen (PERCOCET/ROXICET) 5-325 MG per tablet Take 2 tablets by mouth every 6 (six) hours as needed for severe pain. 11/19/14   Roxy Horseman, PA-C  Polyethyl Glycol-Propyl Glycol (SYSTANE)  0.4-0.3 % GEL ophthalmic gel Place 1 application into both eyes at bedtime as needed. 09/28/17   Belinda Fisher, PA-C    Family History History reviewed. No pertinent family history.  Social History Social History  Substance Use Topics  . Smoking status: Current Every Day Smoker    Packs/day: 0.50  . Smokeless tobacco: Never Used  . Alcohol use 1.2 oz/week    2 Cans of beer per week     Comment: every other day     Allergies   Patient has no known allergies.   Review of Systems Review of Systems  Reason unable to perform ROS: See HPI as above.     Physical Exam Triage Vital Signs ED Triage Vitals [09/28/17 1357]  Enc Vitals Group     BP 108/78     Pulse Rate 85     Resp 16     Temp 98.3 F (36.8 C)     Temp Source Oral     SpO2 98 %     Weight      Height      Head Circumference      Peak Flow      Pain Score      Pain Loc      Pain Edu?      Excl. in GC?    No data found.   Updated Vital Signs BP 108/78 (BP Location: Left Arm)  Pulse 85   Temp 98.3 F (36.8 C) (Oral)   Resp 16   SpO2 98%   Visual Acuity Right Eye Distance: 20/25 Left Eye Distance: 20/25 Bilateral Distance: 20/20  Right Eye Near:   Left Eye Near:    Bilateral Near:     Physical Exam  Constitutional: He is oriented to person, place, and time. He appears well-developed and well-nourished. No distress.  HENT:  Head: Normocephalic and atraumatic.  Eyes: Pupils are equal, round, and reactive to light. EOM are normal. Lids are everted and swept, no foreign bodies found. Right conjunctiva is injected. Left conjunctiva is injected.  Mild eye lid swelling of the right eye. Eye lid normal of left eye.   Fluorescein stain without uptake.  Neurological: He is alert and oriented to person, place, and time.     UC Treatments / Results  Labs (all labs ordered are listed, but only abnormal results are displayed) Labs Reviewed - No data to display  EKG  EKG Interpretation None        Radiology No results found.  Procedures Procedures (including critical care time)  Medications Ordered in UC Medications - No data to display   Initial Impression / Assessment and Plan / UC Course  I have reviewed the triage vital signs and the nursing notes.  Pertinent labs & imaging results that were available during my care of the patient were reviewed by me and considered in my medical decision making (see chart for details).    Start ofloxacin drops as directed. Artificial tears gel as directed. Describes a warm compresses as directed. Patient to follow up with ophthalmology if symptoms worsens or does not improve. Return precautions given.   Final Clinical Impressions(s) / UC Diagnoses   Final diagnoses:  Bacterial conjunctivitis of both eyes    New Prescriptions New Prescriptions   OFLOXACIN (OCUFLOX) 0.3 % OPHTHALMIC SOLUTION    1-2 drops in both eyes every 4 hours for the first 2 days, then 4 times a day for additional 5 days.   POLYETHYL GLYCOL-PROPYL GLYCOL (SYSTANE) 0.4-0.3 % GEL OPHTHALMIC GEL    Place 1 application into both eyes at bedtime as needed.      Belinda Fisher, PA-C 09/28/17 1438

## 2017-09-28 NOTE — ED Triage Notes (Addendum)
Patient presenrts to [facility] with bilateral eye pain, eyes are red and swollen. Pt states eye drainage stared in rt eye on Thursday and now have come over to left eye. Pt has used clear eye drops to try to treat eyes. Pt denies any eye injury

## 2021-07-16 ENCOUNTER — Encounter (HOSPITAL_COMMUNITY): Payer: Self-pay | Admitting: *Deleted

## 2021-07-16 ENCOUNTER — Other Ambulatory Visit: Payer: Self-pay

## 2021-07-16 ENCOUNTER — Inpatient Hospital Stay (HOSPITAL_COMMUNITY)
Admission: EM | Admit: 2021-07-16 | Discharge: 2021-07-18 | DRG: 152 | Disposition: A | Discharge status: Home / Self Care | Payer: Self-pay | Attending: Internal Medicine | Admitting: Internal Medicine

## 2021-07-16 DIAGNOSIS — F1910 Other psychoactive substance abuse, uncomplicated: Secondary | ICD-10-CM | POA: Diagnosis present

## 2021-07-16 DIAGNOSIS — Z8249 Family history of ischemic heart disease and other diseases of the circulatory system: Secondary | ICD-10-CM

## 2021-07-16 DIAGNOSIS — U071 COVID-19: Secondary | ICD-10-CM | POA: Diagnosis present

## 2021-07-16 DIAGNOSIS — J020 Streptococcal pharyngitis: Principal | ICD-10-CM

## 2021-07-16 DIAGNOSIS — J0300 Acute streptococcal tonsillitis, unspecified: Principal | ICD-10-CM | POA: Diagnosis present

## 2021-07-16 DIAGNOSIS — E876 Hypokalemia: Secondary | ICD-10-CM | POA: Diagnosis present

## 2021-07-16 DIAGNOSIS — I10 Essential (primary) hypertension: Secondary | ICD-10-CM | POA: Diagnosis present

## 2021-07-16 DIAGNOSIS — Z560 Unemployment, unspecified: Secondary | ICD-10-CM

## 2021-07-16 DIAGNOSIS — D7589 Other specified diseases of blood and blood-forming organs: Secondary | ICD-10-CM | POA: Diagnosis present

## 2021-07-16 DIAGNOSIS — J039 Acute tonsillitis, unspecified: Secondary | ICD-10-CM | POA: Diagnosis present

## 2021-07-16 MED ORDER — DEXAMETHASONE SODIUM PHOSPHATE 10 MG/ML IJ SOLN
10.0000 mg | Freq: Once | INTRAMUSCULAR | Status: AC
  Administered 2021-07-16: 10 mg via INTRAMUSCULAR
  Filled 2021-07-16: qty 1

## 2021-07-16 NOTE — ED Provider Notes (Signed)
Emergency Medicine Provider Triage Evaluation Note  JEKHI BOLIN , a 45 y.o. male  was evaluated in triage.  Pt complains of sore throat with left side throat swelling and change in voice. Onset a few weeks ago, progressively worsening.  Review of Systems  Positive: Sore throat, voice change, swelling Negative: fever  Physical Exam  BP (!) 237/199 (BP Location: Left Arm)   Pulse (!) 103   Temp 99.5 F (37.5 C) (Oral)   Resp 18   SpO2 99%  Gen:   Awake, no distress , tolerating secretions  Resp:  Normal effort  MSK:   Moves extremities without difficulty  Other:  Large left PTA  Medical Decision Making  Medically screening exam initiated at 11:44 PM.  Appropriate orders placed.  REI MEDLEN was informed that the remainder of the evaluation will be completed by another provider, this initial triage assessment does not replace that evaluation, and the importance of remaining in the ED until their evaluation is complete.     Jeannie Fend, PA-C 07/16/21 2346    Jacalyn Lefevre, MD 07/24/21 [phone]

## 2021-07-16 NOTE — ED Triage Notes (Signed)
Pt has been having ongoing sore throat for 3 weeks, swollen on both sides of his neck. Reports difficulty swallowing

## 2021-07-17 ENCOUNTER — Emergency Department (HOSPITAL_COMMUNITY): Payer: Self-pay

## 2021-07-17 DIAGNOSIS — J039 Acute tonsillitis, unspecified: Secondary | ICD-10-CM | POA: Diagnosis present

## 2021-07-17 DIAGNOSIS — E876 Hypokalemia: Secondary | ICD-10-CM

## 2021-07-17 DIAGNOSIS — I10 Essential (primary) hypertension: Secondary | ICD-10-CM

## 2021-07-17 DIAGNOSIS — U071 COVID-19: Secondary | ICD-10-CM

## 2021-07-17 LAB — CBC WITH DIFFERENTIAL/PLATELET
Abs Immature Granulocytes: 0.36 10*3/uL — ABNORMAL HIGH (ref 0.00–0.07)
Basophils Absolute: 0.1 10*3/uL (ref 0.0–0.1)
Basophils Relative: 0 %
Eosinophils Absolute: 0 10*3/uL (ref 0.0–0.5)
Eosinophils Relative: 0 %
HCT: 49 % (ref 39.0–52.0)
Hemoglobin: 16.8 g/dL (ref 13.0–17.0)
Immature Granulocytes: 1 %
Lymphocytes Relative: 5 %
Lymphs Abs: 1.3 10*3/uL (ref 0.7–4.0)
MCH: 35 pg — ABNORMAL HIGH (ref 26.0–34.0)
MCHC: 34.3 g/dL (ref 30.0–36.0)
MCV: 102.1 fL — ABNORMAL HIGH (ref 80.0–100.0)
Monocytes Absolute: 2.6 10*3/uL — ABNORMAL HIGH (ref 0.1–1.0)
Monocytes Relative: 10 %
Neutro Abs: 20.6 10*3/uL — ABNORMAL HIGH (ref 1.7–7.7)
Neutrophils Relative %: 84 %
Platelets: 195 10*3/uL (ref 150–400)
RBC: 4.8 MIL/uL (ref 4.22–5.81)
RDW: 13 % (ref 11.5–15.5)
WBC: 25.5 10*3/uL — ABNORMAL HIGH (ref 4.0–10.5)
nRBC: 0 % (ref 0.0–0.2)

## 2021-07-17 LAB — MAGNESIUM: Magnesium: 1.7 mg/dL (ref 1.7–2.4)

## 2021-07-17 LAB — BASIC METABOLIC PANEL
Anion gap: 14 (ref 5–15)
BUN: 8 mg/dL (ref 6–20)
CO2: 23 mmol/L (ref 22–32)
Calcium: 9.3 mg/dL (ref 8.9–10.3)
Chloride: 94 mmol/L — ABNORMAL LOW (ref 98–111)
Creatinine, Ser: 1.13 mg/dL (ref 0.61–1.24)
GFR, Estimated: >60 mL/min (ref >60)
Glucose, Bld: 133 mg/dL — ABNORMAL HIGH (ref 70–99)
Potassium: 2.8 mmol/L — ABNORMAL LOW (ref 3.5–5.1)
Sodium: 131 mmol/L — ABNORMAL LOW (ref 135–145)

## 2021-07-17 LAB — RAPID URINE DRUG SCREEN, HOSP PERFORMED
Amphetamines: NOT DETECTED
Barbiturates: NOT DETECTED
Benzodiazepines: NOT DETECTED
Cocaine: POSITIVE — AB
Opiates: NOT DETECTED
Tetrahydrocannabinol: POSITIVE — AB

## 2021-07-17 LAB — HIV ANTIBODY (ROUTINE TESTING W REFLEX): HIV Screen 4th Generation wRfx: NONREACTIVE

## 2021-07-17 LAB — VITAMIN B12: Vitamin B-12: 272 pg/mL (ref 180–914)

## 2021-07-17 LAB — MRSA NEXT GEN BY PCR, NASAL: MRSA by PCR Next Gen: NOT DETECTED

## 2021-07-17 LAB — RESP PANEL BY RT-PCR (FLU A&B, COVID) ARPGX2
Influenza A by PCR: NEGATIVE
Influenza B by PCR: NEGATIVE
SARS Coronavirus 2 by RT PCR: POSITIVE — AB

## 2021-07-17 LAB — GROUP A STREP BY PCR: Group A Strep by PCR: DETECTED — AB

## 2021-07-17 LAB — TSH: TSH: 0.249 u[IU]/mL — ABNORMAL LOW (ref 0.350–4.500)

## 2021-07-17 LAB — FOLATE: Folate: 7 ng/mL (ref >5.9)

## 2021-07-17 MED ORDER — SODIUM CHLORIDE 0.9 % IV BOLUS
1000.0000 mL | Freq: Once | INTRAVENOUS | Status: AC
  Administered 2021-07-17: 1000 mL via INTRAVENOUS

## 2021-07-17 MED ORDER — AMPICILLIN-SULBACTAM SODIUM 3 (2-1) G IJ SOLR: 3.0000 g | Freq: Once | INTRAMUSCULAR | Status: DC

## 2021-07-17 MED ORDER — HYDROCHLOROTHIAZIDE 12.5 MG PO CAPS
12.5000 mg | ORAL_CAPSULE | Freq: Every day | ORAL | Status: DC
  Administered 2021-07-17: 12.5 mg via ORAL
  Filled 2021-07-17: qty 1

## 2021-07-17 MED ORDER — LISINOPRIL 10 MG PO TABS
10.0000 mg | ORAL_TABLET | Freq: Every day | ORAL | Status: DC
  Administered 2021-07-17 – 2021-07-18 (×2): 10 mg via ORAL
  Filled 2021-07-17 (×2): qty 1

## 2021-07-17 MED ORDER — HYDRALAZINE HCL 20 MG/ML IJ SOLN
10.0000 mg | Freq: Once | INTRAMUSCULAR | Status: AC
  Administered 2021-07-17: 10 mg via INTRAVENOUS
  Filled 2021-07-17: qty 1

## 2021-07-17 MED ORDER — HYDROCHLOROTHIAZIDE 25 MG PO TABS
25.0000 mg | ORAL_TABLET | Freq: Every day | ORAL | Status: DC
  Administered 2021-07-18: 25 mg via ORAL
  Filled 2021-07-17: qty 1

## 2021-07-17 MED ORDER — CLINDAMYCIN PHOSPHATE 900 MG/50ML IV SOLN
900.0000 mg | Freq: Once | INTRAVENOUS | Status: AC
  Administered 2021-07-17: 900 mg via INTRAVENOUS
  Filled 2021-07-17: qty 50

## 2021-07-17 MED ORDER — LABETALOL HCL 5 MG/ML IV SOLN
10.0000 mg | Freq: Once | INTRAVENOUS | Status: AC
  Administered 2021-07-17: 10 mg via INTRAVENOUS
  Filled 2021-07-17: qty 4

## 2021-07-17 MED ORDER — ENOXAPARIN SODIUM 40 MG/0.4ML IJ SOSY
40.0000 mg | PREFILLED_SYRINGE | INTRAMUSCULAR | Status: DC
  Administered 2021-07-17 – 2021-07-18 (×2): 40 mg via SUBCUTANEOUS
  Filled 2021-07-17 (×2): qty 0.4

## 2021-07-17 MED ORDER — LIDOCAINE VISCOUS HCL 2 % MT SOLN
15.0000 mL | OROMUCOSAL | Status: DC | PRN
  Filled 2021-07-17: qty 15

## 2021-07-17 MED ORDER — CLINDAMYCIN PHOSPHATE 600 MG/50ML IV SOLN
600.0000 mg | Freq: Three times a day (TID) | INTRAVENOUS | Status: DC
  Administered 2021-07-17 – 2021-07-18 (×4): 600 mg via INTRAVENOUS
  Filled 2021-07-17 (×4): qty 50

## 2021-07-17 MED ORDER — POTASSIUM CHLORIDE 10 MEQ/100ML IV SOLN
10.0000 meq | Freq: Once | INTRAVENOUS | Status: AC
  Administered 2021-07-17: 10 meq via INTRAVENOUS
  Filled 2021-07-17: qty 100

## 2021-07-17 MED ORDER — IOHEXOL 300 MG/ML  SOLN
100.0000 mL | Freq: Once | INTRAMUSCULAR | Status: AC | PRN
  Administered 2021-07-17: 100 mL via INTRAVENOUS

## 2021-07-17 NOTE — ED Provider Notes (Signed)
[facility] EMERGENCY DEPARTMENT Provider Note   CSN: [id number] Arrival date & time: 07/16/21  2122     History Chief Complaint  Patient presents with   Sore Throat    Jeremiah Curry is a 45 y.o. male.  Patient presents to the emergency department with a chief complaint of sore throat.  He told provider in triage that it started a few weeks ago, but tells me that it started about 4 days ago.  He states that it is progressively worsened.  He denies any fevers or chills.  He reports sore throat, throat swelling, and voice change.  He has not had any successful treatments prior to arrival.  Denies any other associated symptoms.  The history is provided by the patient. No language interpreter was used.      Past Medical History:  Diagnosis Date   Asthma     There are no problems to display for this patient.   History reviewed. No pertinent surgical history.     No family history on file.  Social History   Tobacco Use   Smoking status: Every Day    Packs/day: 0.50    Types: Cigarettes   Smokeless tobacco: Never  Substance Use Topics   Alcohol use: Yes    Alcohol/week: 2.0 standard drinks    Types: 2 Cans of beer per week    Comment: every other day   Drug use: Yes    Types: Marijuana    Home Medications Prior to Admission medications   Medication Sig Start Date End Date Taking? Authorizing Provider  doxycycline (VIBRAMYCIN) 100 MG capsule Take 1 capsule (100 mg total) by mouth 2 (two) times daily. 02/02/16   Elson Areas, PA-C  ibuprofen (ADVIL,MOTRIN) 800 MG tablet Take 1 tablet (800 mg total) by mouth 3 (three) times daily. 02/02/16   Elson Areas, PA-C  ofloxacin (OCUFLOX) 0.3 % ophthalmic solution 1-2 drops in both eyes every 4 hours for the first 2 days, then 4 times a day for additional 5 days. 09/28/17   Cathie Hoops, Amy V, PA-C  oxyCODONE-acetaminophen (PERCOCET/ROXICET) 5-325 MG per tablet Take 2 tablets by mouth every 6 (six) hours as needed  for severe pain. 11/19/14   Roxy Horseman, PA-C  Polyethyl Glycol-Propyl Glycol (SYSTANE) 0.4-0.3 % GEL ophthalmic gel Place 1 application into both eyes at bedtime as needed. 09/28/17   Belinda Fisher, PA-C    Allergies    Patient has no known allergies.  Review of Systems   Review of Systems  All other systems reviewed and are negative.  Physical Exam Updated Vital Signs BP (!) 215/149 (BP Location: Left Arm)   Pulse 89   Temp 99 F (37.2 C) (Oral)   Resp 18   SpO2 100%   Physical Exam Vitals and nursing note reviewed.  Constitutional:      General: He is not in acute distress.    Appearance: He is well-developed. He is not ill-appearing.  HENT:     Head: Normocephalic and atraumatic.     Mouth/Throat:     Comments: Large left-sided peritonsillar abscess Eyes:     Conjunctiva/sclera: Conjunctivae normal.  Cardiovascular:     Rate and Rhythm: Normal rate.  Pulmonary:     Effort: Pulmonary effort is normal. No respiratory distress.  Abdominal:     General: There is no distension.  Musculoskeletal:     Cervical back: Neck supple.     Comments: Moves all extremities  Skin:  General: Skin is warm and dry.  Neurological:     Mental Status: He is alert and oriented to person, place, and time.  Psychiatric:        Mood and Affect: Mood normal.        Behavior: Behavior normal.    ED Results / Procedures / Treatments   Labs (all labs ordered are listed, but only abnormal results are displayed) Labs Reviewed  GROUP A STREP BY PCR - Abnormal; Notable for the following components:      Result Value   Group A Strep by PCR DETECTED (*)    All other components within normal limits  BASIC METABOLIC PANEL - Abnormal; Notable for the following components:   Sodium 131 (*)    Potassium 2.8 (*)    Chloride 94 (*)    Glucose, Bld 133 (*)    All other components within normal limits  CBC WITH DIFFERENTIAL/PLATELET - Abnormal; Notable for the following components:   WBC 25.5  (*)    MCV 102.1 (*)    MCH 35.0 (*)    Neutro Abs 20.6 (*)    Monocytes Absolute 2.6 (*)    Abs Immature Granulocytes 0.36 (*)    All other components within normal limits    EKG None  Radiology No results found.  Procedures Procedures   Medications Ordered in ED Medications  sodium chloride 0.9 % bolus 1,000 mL (has no administration in time range)  clindamycin (CLEOCIN) IVPB 900 mg (has no administration in time range)  dexamethasone (DECADRON) injection 10 mg (10 mg Intramuscular Given 07/16/21 2357)    ED Course  I have reviewed the triage vital signs and the nursing notes.  Pertinent labs & imaging results that were available during my care of the patient were reviewed by me and considered in my medical decision making (see chart for details).    MDM Rules/Calculators/A&P                           Patient here with sore throat, worsening over the past 4 days.  Physical exam seems consistent with peritonsillar abscess.  Will confirm with CT.  Patient given Decadron in triage.  Will give clindamycin and some fluids.  White count is 25.5.  Strep test positive.  Evidence of dehydration on BMP. Final Clinical Impression(s) / ED Diagnoses Final diagnoses:  Strep pharyngitis  Hypokalemia  Hypertension, unspecified type    Rx / DC Orders ED Discharge Orders     None        Roxy Horseman, PA-C 07/17/21 2202    Zadie Rhine, MD 07/19/21 2310

## 2021-07-17 NOTE — ED Notes (Signed)
Admitting at the bedside.  

## 2021-07-17 NOTE — ED Provider Notes (Signed)
Care assumed from Jeremiah Horseman, PA-C at shift change with CT neck pending.   In brief, this patient is a 45 yo M who presents for evaluation of sore throat that started about 4 days ago. No fevers, chills. He feels like he has had some voice changes. No SOB. Please see note from previous provider for full history/physical exam.     Physical Exam  BP (!) 201/118   Pulse 94   Temp 98.4 F (36.9 C) (Oral)   Resp 17   SpO2 100%   Physical Exam  Left tonsillar swelling noted.  Uvula appears displaced.  He does have some muffled phonation.  He is tolerating secretions.  Unable to visualize posterior oropharynx completely given swelling.  ED Course/Procedures     Procedures  Results for orders placed or performed during the hospital encounter of 07/16/21 (from the past 24 hour(s))  Basic metabolic panel     Status: Abnormal   Collection Time: 07/16/21 11:47 PM  Result Value Ref Range   Sodium 131 (L) 135 - 145 mmol/L   Potassium 2.8 (L) 3.5 - 5.1 mmol/L   Chloride 94 (L) 98 - 111 mmol/L   CO2 23 22 - 32 mmol/L   Glucose, Bld 133 (H) 70 - 99 mg/dL   BUN 8 6 - 20 mg/dL   Creatinine, Ser 2.95 0.61 - 1.24 mg/dL   Calcium 9.3 8.9 - 28.4 mg/dL   GFR, Estimated >13 >24 mL/min   Anion gap 14 5 - 15  CBC with Differential     Status: Abnormal   Collection Time: 07/16/21 11:47 PM  Result Value Ref Range   WBC 25.5 (H) 4.0 - 10.5 K/uL   RBC 4.80 4.22 - 5.81 MIL/uL   Hemoglobin 16.8 13.0 - 17.0 g/dL   HCT 40.1 02.7 - 25.3 %   MCV 102.1 (H) 80.0 - 100.0 fL   MCH 35.0 (H) 26.0 - 34.0 pg   MCHC 34.3 30.0 - 36.0 g/dL   RDW 66.4 40.3 - 47.4 %   Platelets 195 150 - 400 K/uL   nRBC 0.0 0.0 - 0.2 %   Neutrophils Relative % 84 %   Neutro Abs 20.6 (H) 1.7 - 7.7 K/uL   Lymphocytes Relative 5 %   Lymphs Abs 1.3 0.7 - 4.0 K/uL   Monocytes Relative 10 %   Monocytes Absolute 2.6 (H) 0.1 - 1.0 K/uL   Eosinophils Relative 0 %   Eosinophils Absolute 0.0 0.0 - 0.5 K/uL   Basophils Relative 0 %    Basophils Absolute 0.1 0.0 - 0.1 K/uL   Immature Granulocytes 1 %   Abs Immature Granulocytes 0.36 (H) 0.00 - 0.07 K/uL  Resp Panel by RT-PCR (Flu A&B, Covid) Nasopharyngeal Swab     Status: Abnormal   Collection Time: 07/17/21  9:47 AM   Specimen: Nasopharyngeal Swab; Nasopharyngeal(NP) swabs in vial transport medium  Result Value Ref Range   SARS Coronavirus 2 by RT PCR POSITIVE (A) NEGATIVE   Influenza A by PCR NEGATIVE NEGATIVE   Influenza B by PCR NEGATIVE NEGATIVE    MDM    PLAN: Pending CT scan.  MDM:  CT scan shows tonsillitis with phlegmon but no discrete drainable peritonsillar abscess.  The inflammatory changes extend inferiorly to the level of the hypopharynx.  Small retropharyngeal effusion is present.  There is some associated airway narrowing and reactive adenopathy.  Discussed patient with Dr. Elijah Birk (ENT). Agrees with plan for admission. Recommends IV abx and steroids.  Patient is hypertensive.  He does not have any history of hypertension but states he has not seen a doctor in several years.  Will give labetalol.  Discussed with internal medicine team who accepts patient for admission.   1. Strep pharyngitis   2. Hypokalemia   3. Hypertension, unspecified type      Portions of this note were generated with Scientist, clinical (histocompatibility and immunogenetics). Dictation errors may occur despite best attempts at proofreading.      Maxwell Caul, PA-C 07/17/21 1223    Wynetta Fines, MD 07/17/21 [phone]

## 2021-07-17 NOTE — ED Notes (Signed)
Sande Brothers, MD aware of pts persistently high BP. States need for UDS. No further orders placed at this time.

## 2021-07-17 NOTE — ED Notes (Signed)
Ardeen Jourdain, PA made aware of pts BP.

## 2021-07-17 NOTE — H&P (Addendum)
Date: 07/17/2021               Patient Name:  Jeremiah Curry MRN: [id number]  DOB: [birth year] Age / Sex: 45 y.o., male   PCP: Patient, No Pcp Per (Inactive)         Medical Service: Internal Medicine Teaching Service         Attending Physician: Dr. Mikey Bussing, Marthenia Rolling, DO    First Contact: Dr. Burnice Logan Pager: [phone]  Second Contact: Dr. Mcarthur Rossetti Pager: [phone]       After Hours (After 5p/  First Contact Pager: [phone]  weekends / holidays): Second Contact Pager: [phone]   Chief Complaint: Swollen neck  History of Present Illness:  Jeremiah Curry is a 45 year old male, with no known past medical history, who presents to the ED with 3 weeks of sore throat and 4 days of a swollen neck.  Patient states that his sore throat started approximately 2 weeks ago, and was initially concerned because his brother tested positive for COVID 19.  His symptoms started worsening over the past 4 days.  On day 1, he states that his neck and upper back began to hurt.  On day 2, he began to notice swelling of his throat, mild changes in voice, episodes of cold or hot flashes.  On day 3, he noticed cough with bright red blood.  On day 4, his cough was productive for dark red sputum, which prompted him to come to the emergency department.  He has tried cough syrup and ibuprofen in the outpatient setting which do little to alleviate his symptoms.    He denies fevers, dizziness, lightheadedness, headaches, visual changes, chest pain, dyspnea, abdominal pain, constipation, or diarrhea.  He has noticed change in voice, but is able to handle secretions speak with full sentences with little difficulty.  He states is easier to swallow his sputum today than it was yesterday.   Meds:  Current Meds  Medication Sig   guaifenesin (ROBITUSSIN) 100 MG/5ML syrup Take 200 mg by mouth 3 (three) times daily as needed for cough (sore throat).   ibuprofen (ADVIL) 200 MG tablet Take 200 mg by mouth every 6 (six) hours as needed.      Allergies: Allergies as of 07/16/2021   (No Known Allergies)   Past Medical History:  Diagnosis Date   Asthma     Family History:  Father passed of MI  Social History:  Work: Currently unemployed Housing: Lives at home with wife Caffeine: Drinks tea and soda Tobacco: 20 pack year Hx ETOH: 5-6 beverages per week Drugs: Occasional marijuana, Cocaine (Last dose several months ago)  Review of Systems: A complete ROS was negative except as per HPI.   Physical Exam: Blood pressure (!) 206/110, pulse 70, temperature 98.4 F (36.9 C), temperature source Oral, resp. rate (!) 21, SpO2 97 %. Physical Exam Constitutional:      Appearance: He is normal weight. He is ill-appearing and diaphoretic. He is not toxic-appearing.     Comments: Ill in appearance, able to answer appropriately in full sentences.   HENT:     Head: Normocephalic.     Nose: No congestion.     Mouth/Throat:     Mouth: Mucous membranes are moist.     Pharynx: Pharyngeal swelling present.     Comments: Enlarged L sided tonsil with right sided displaced uvula  Neck:     Comments: No, submental, submandibular, superficial or deep cervical adenopathy noted bilaterally Cardiovascular:  Rate and Rhythm: Normal rate and regular rhythm.     Pulses: Normal pulses.     Heart sounds: Normal heart sounds. No murmur heard.   No friction rub. No gallop.  Pulmonary:     Effort: Pulmonary effort is normal.     Breath sounds: Normal breath sounds. No wheezing, rhonchi or rales.     Comments: Saturating at 97% on room air Abdominal:     General: Abdomen is flat. Bowel sounds are normal.     Palpations: Abdomen is soft.     Tenderness: There is no abdominal tenderness. There is no guarding.  Musculoskeletal:     Cervical back: No rigidity or tenderness.     Right lower leg: No edema.     Left lower leg: No edema.  Lymphadenopathy:     Cervical: No cervical adenopathy.  Neurological:     Mental Status: He is  alert and oriented to person, place, and time.  Psychiatric:        Mood and Affect: Mood normal.        Behavior: Behavior normal.     EKG: personally reviewed my interpretation shows normal sinus rhythm with left ventricular hypertrophy with no ischemic changes.  CT Soft Tissue Neck W Contrast  Result Date: 07/17/2021 CLINICAL DATA:  Epiglottitis or tonsillitis suspected; peritonsillar abscess; sore throat EXAM: CT NECK WITH CONTRAST TECHNIQUE: Multidetector CT imaging of the neck was performed using the standard protocol following the bolus administration of intravenous contrast. CONTRAST:  OMNIPAQUE IOHEXOL 300 MG/ML  SOLN COMPARISON:  None. FINDINGS: Pharynx and larynx: Enlargement of the palatine tonsils, left greater than right. Ill-defined slightly lower attenuation regions are present likely reflecting phlegmon without discrete drainable abscess at this time. Associated narrowing of the airway. Pharyngeal wall swelling extends inferiorly on the left posteriorly and laterally to the level of the hypopharynx. There is effacement of the left piriform sinus. Retropharyngeal effusion is present measuring approximately 3 mm in thickness. Left greater than right parapharyngeal inflammatory changes are present. Salivary glands: Unremarkable. Inflammatory changes in the left submandibular space related to above. Thyroid: Normal. Lymph nodes: Enlarged left level 2 nodes are likely reactive. For example, 19 mm node on series 3, image 84. Additional asymmetric nonenlarged left nodes are also likely reactive. Vascular: Major neck vessels are patent. There is eccentric noncalcified plaque carotid mild calcified plaque at the common carotid bifurcations. Limited intracranial: No abnormal enhancement. Visualized orbits: Unremarkable. Mastoids and visualized paranasal sinuses: Mastoid air cells are clear. Mild paranasal sinus mucosal thickening. Skeleton: Multiple periapical lucencies about significantly  decayed teeth. Minor degenerative changes of the cervical spine. Upper chest: Included upper lungs are clear. Other: None. IMPRESSION: Tonsillitis with phlegmon but no discrete/drainable peritonsillar abscess at this time. Inflammatory changes extend inferiorly to the level of the hypopharynx. Small retropharyngeal effusion is present. Associated airway narrowing and reactive adenopathy. Electronically Signed   By: Guadlupe Spanish M.D.   On: 07/17/2021 07:18     Assessment & Plan by Problem: Active Problems:   Tonsillitis, phlegmonous  Jeremiah Curry is a 45 year old male, with no known prior medical history who presented with 2 weeks of sore throat and neck swelling and admitted for tonsillitis with phlegmon.  Tonsillitis associated with Group A Strep and Retropharyngeal Effusion: Patient presents with sore throat, and 4 days of associated throat swelling found to have tonsillitis with phlegmon on CT, and positive for group A strep.  Labs are concerning for leukocytosis of 25.5, and is afebrile. at this  time patient is saturating well on room air, is able to handle his secretions, and speak in full sentences.  ED consulted with ENT during his initial presentation, who recommended medical admission and treatment with IV steroids, IV antibiotics and observation.  He received 10 mg of Decadron in the IV, and clindamycin IV 900 mg in the ED. - Clindamycin 600 mg IV every 8 hours - MRSA PCR nasal - Trend CBC - Pulse ox  Asymptomatic Severe Uncontrolled Hypertension: Patient presents with asymptomatic severely uncontrolled hypertension with systolics greater than 200 diastolics greater than 100.  Patient has not seen a PCP " in years."  He does have a remote history of cocaine, which he snorts, but his last cocaine use was several months ago.  He is received IV labetalol and hydralazine in the emergency department.  His blood pressures continue to be elevated with systolics greater than 200.  He is  asymptomatic does not complain of dizziness, lightheadedness, chest pain, abdominal pain, changes in urine.  His labs do not show evidence of AKI.  He is unsure if he has a family history of hypertension, but states that his father did die of heart attack.  Given remote cocaine use, will check UDS today.  Given his kidney function we will start him on lisinopril and hydrochlorothiazide today daily, will hold off on IV beta-blockers until results of UDS are finalized. - Obtain UDS - Start lisinopril 10 mg daily - Start hydrochlorothiazide 12.5 mg daily - Continue to follow-up BMP - Obtain EKG  COVID-19: Patient presents with COVID-19 with known exposure of his brother 2 weeks ago.  Patient has been having sore throat for the past 2 weeks.  He is not requiring supplemental oxygen at this time.  Received IV Decadron in the ED for tonsillitis.  Does not require additional interventions at this time, but will continue to monitor his hospital course.  Hypokalemia: Patient found to be hypokalemic with a potassium of 2.8 on his initial evaluation in the ED.  He has no new medications that would cause hypokalemia, this may be in the setting of his uncontrolled hypertension versus hypomagnesia versus hyperaldosteronism.  Given hypertension with hypokalemia will obtain plasma Doster and renin ratio. - Obtain serum magnesium - Obtain plasma aldosterone to renin ratio  Macrocytosis without Anemia: Patient presents with MCV of 102.1 and hemoglobin of 16.8.  He is on any medications that would cause macrocytosis, he does drink alcohol approximately 5-6 shots per week. - Vitamin B12 - Folate - TSH  Dispo: Admit patient to Inpatient with expected length of stay greater than 2 midnights.  Signed: Dolan Amen, MD 07/17/2021, 12:02 PM  Pager: [phone] After 5pm on weekdays and 1pm on weekends: On Call pager: [phone]

## 2021-07-17 NOTE — ED Notes (Signed)
Meal tray delivered to patient.  

## 2021-07-18 ENCOUNTER — Other Ambulatory Visit (HOSPITAL_COMMUNITY): Payer: Self-pay

## 2021-07-18 DIAGNOSIS — I959 Hypotension, unspecified: Secondary | ICD-10-CM

## 2021-07-18 DIAGNOSIS — J020 Streptococcal pharyngitis: Secondary | ICD-10-CM | POA: Diagnosis present

## 2021-07-18 DIAGNOSIS — U071 COVID-19: Secondary | ICD-10-CM | POA: Diagnosis present

## 2021-07-18 LAB — BASIC METABOLIC PANEL
Anion gap: 12 (ref 5–15)
Anion gap: 10 (ref 5–15)
BUN: 11 mg/dL (ref 6–20)
BUN: 11 mg/dL (ref 6–20)
CO2: 27 mmol/L (ref 22–32)
CO2: 26 mmol/L (ref 22–32)
Calcium: 9.5 mg/dL (ref 8.9–10.3)
Calcium: 9.3 mg/dL (ref 8.9–10.3)
Chloride: 98 mmol/L (ref 98–111)
Chloride: 99 mmol/L (ref 98–111)
Creatinine, Ser: 0.99 mg/dL (ref 0.61–1.24)
Creatinine, Ser: 0.99 mg/dL (ref 0.61–1.24)
GFR, Estimated: >60 mL/min (ref >60)
GFR, Estimated: >60 mL/min (ref >60)
Glucose, Bld: 111 mg/dL — ABNORMAL HIGH (ref 70–99)
Glucose, Bld: 130 mg/dL — ABNORMAL HIGH (ref 70–99)
Potassium: 2.8 mmol/L — ABNORMAL LOW (ref 3.5–5.1)
Potassium: 3 mmol/L — ABNORMAL LOW (ref 3.5–5.1)
Sodium: 137 mmol/L (ref 135–145)
Sodium: 135 mmol/L (ref 135–145)

## 2021-07-18 LAB — CBC
HCT: 47.4 % (ref 39.0–52.0)
Hemoglobin: 17 g/dL (ref 13.0–17.0)
MCH: 35.7 pg — ABNORMAL HIGH (ref 26.0–34.0)
MCHC: 35.9 g/dL (ref 30.0–36.0)
MCV: 99.6 fL (ref 80.0–100.0)
Platelets: 172 10*3/uL (ref 150–400)
RBC: 4.76 MIL/uL (ref 4.22–5.81)
RDW: 13 % (ref 11.5–15.5)
WBC: 18.2 10*3/uL — ABNORMAL HIGH (ref 4.0–10.5)
nRBC: 0 % (ref 0.0–0.2)

## 2021-07-18 LAB — T4, FREE: Free T4: 1.05 ng/dL (ref 0.61–1.12)

## 2021-07-18 LAB — MAGNESIUM: Magnesium: 1.7 mg/dL (ref 1.7–2.4)

## 2021-07-18 MED ORDER — POTASSIUM CHLORIDE 10 MEQ/100ML IV SOLN
10.0000 meq | INTRAVENOUS | Status: AC
  Administered 2021-07-18 (×3): 10 meq via INTRAVENOUS
  Filled 2021-07-18 (×3): qty 100

## 2021-07-18 MED ORDER — CLINDAMYCIN HCL 300 MG PO CAPS
300.0000 mg | ORAL_CAPSULE | Freq: Three times a day (TID) | ORAL | 0 refills | Status: AC
  Filled 2021-07-18: qty 24, 8d supply, fill #0

## 2021-07-18 MED ORDER — HYDROCHLOROTHIAZIDE 25 MG PO TABS
25.0000 mg | ORAL_TABLET | Freq: Every day | ORAL | 0 refills | Status: DC
  Filled 2021-07-18: qty 30, 30d supply, fill #0

## 2021-07-18 MED ORDER — LABETALOL HCL 5 MG/ML IV SOLN
5.0000 mg | INTRAVENOUS | Status: DC | PRN
  Administered 2021-07-18: 5 mg via INTRAVENOUS
  Filled 2021-07-18: qty 4

## 2021-07-18 MED ORDER — HYDROCHLOROTHIAZIDE 12.5 MG PO CAPS
12.5000 mg | ORAL_CAPSULE | Freq: Once | ORAL | Status: AC
  Administered 2021-07-18: 12.5 mg via ORAL
  Filled 2021-07-18: qty 1

## 2021-07-18 MED ORDER — POTASSIUM CHLORIDE CRYS ER 20 MEQ PO TBCR
40.0000 meq | EXTENDED_RELEASE_TABLET | Freq: Two times a day (BID) | ORAL | Status: DC
  Administered 2021-07-18: 40 meq via ORAL
  Filled 2021-07-18: qty 2

## 2021-07-18 MED ORDER — LISINOPRIL 10 MG PO TABS
10.0000 mg | ORAL_TABLET | Freq: Every day | ORAL | 0 refills | Status: DC
  Filled 2021-07-18: qty 30, 30d supply, fill #0

## 2021-07-18 MED ORDER — HYDRALAZINE HCL 10 MG PO TABS
10.0000 mg | ORAL_TABLET | Freq: Once | ORAL | Status: AC
  Administered 2021-07-18: 10 mg via ORAL
  Filled 2021-07-18: qty 1

## 2021-07-18 NOTE — Discharge Summary (Addendum)
Name: Jeremiah Curry MRN: [id number] DOB: 1976-02-19 45 y.o. PCP: Patient, No Pcp Per (Inactive)  Date of Admission: 07/16/2021 10:59 PM Date of Discharge: 07/18/21 Attending Physician: Gust Rung, DO  Discharge Diagnosis: 1. Tonsillitis associated with group A strep and retropharyngeal effusion Asymptomatic severe uncontrolled hypertension COVID-19 Hypokalemia Macrocytosis without anemia Polysubstance use disorder  Discharge Medications: Allergies as of 07/18/2021   No Known Allergies      Medication List     STOP taking these medications    doxycycline 100 MG capsule Commonly known as: VIBRAMYCIN   guaifenesin 100 MG/5ML syrup Commonly known as: ROBITUSSIN   ibuprofen 200 MG tablet Commonly known as: ADVIL   ibuprofen 800 MG tablet Commonly known as: ADVIL   ofloxacin 0.3 % ophthalmic solution Commonly known as: Ocuflox   oxyCODONE-acetaminophen 5-325 MG tablet Commonly known as: PERCOCET/ROXICET   Polyethyl Glycol-Propyl Glycol 0.4-0.3 % Gel ophthalmic gel Commonly known as: Systane       TAKE these medications    clindamycin 300 MG capsule Commonly known as: CLEOCIN Take 1 capsule (300 mg total) by mouth 3 (three) times daily for 8 days.   hydrochlorothiazide 25 MG tablet Commonly known as: HYDRODIURIL Take 1 tablet (25 mg total) by mouth daily. Start taking on: July 19, 2021   lisinopril 10 MG tablet Commonly known as: ZESTRIL Take 1 tablet (10 mg total) by mouth daily. Start taking on: July 19, 2021        Disposition and follow-up:   Jeremiah Curry was discharged from [facility] in Stable condition.  At the hospital follow up visit please address:  1.  Tonsillitis associated with Group A Strep and Retropharyngeal Effusion: Tonsillitis with phlegmon on CT, and positive for group A strep.  Labs are concerning for leukocytosis of 25.5, and is afebrile. at this time patient is saturating well on room air, is able to  handle his secretions, and speak in full sentences.  ED consulted with ENT during his initial presentation, who recommended medical admission and treatment with IV steroids, IV antibiotics and observation.  He received 10 mg of Decadron in the IV, and clindamycin IV 900 mg in the ED. - continue oral clindamycin 300mg  TID as outpatient for 8 more days until 8/3    Asymptomatic Severe Uncontrolled Hypertension: Patient presents with asymptomatic severely uncontrolled hypertension with systolics greater than 200 diastolics greater than 100. He is asymptomatic does not complain of dizziness, lightheadedness, chest pain, abdominal pain, changes in urine.   - UDS positive for cocaine, avoid BB - Continue HCTZ 25mg  and hydralazine 10mg      COVID-19: Patient presents with COVID-19 with known exposure of his brother 2 weeks ago.  Patient has been having sore throat for the past 2 weeks.  He is not requiring supplemental oxygen at this time.  Received IV Decadron in the ED for tonsillitis.  Does not require additional interventions at this time, but will continue to monitor his hospital course. - patient is asymptomatic and was found to be positive incidentally. He will need to isolate for an additional 4 days with strict mask wearing for an additional 5 days.  Hypokalemia: Patient found to be hypokalemic with a potassium of 2.8 on his initial evaluation in the ED.  He has no new medications that would cause hypokalemia, this may be in the setting of his uncontrolled hypertension versus hypomagnesia versus hyperaldosteronism.  Given hypertension with hypokalemia will obtain plasma aldosterone and renin ratio. - monitor  Macrocytosis without Anemia: Patient presents with MCV of 102.1 and hemoglobin of 16.8.  He is on any medications that would cause macrocytosis, he does drink alcohol approximately 5-6 shots per week.  Polysubstance Use disorder - UDS positive for cocaine and THC. Counseled patient on  importance of abstinence from these drugs, especially cocaine given his elevated blood pressures - encourage cessation  2.  Labs / imaging needed at time of follow-up: CBC, BMP, mag, phos  3.  Pending labs/ test needing follow-up: none  Follow-up Appointments:  Follow-up Information     [facility] Follow up on 08/29/2021.   Specialty: Family Medicine Why: 9:30 for hospital follow virtual follow up Contact information: [address] [place] [facility] [address] [phone]                Hospital Course by problem list: 1. Tonsillitis associated with Group A strep and Retropharyngeal effusion - patient presented with complaints of odynophagia and neck swelling. On CT tonsillitis with phlegmon was noted without abscess. ENT was consulted and recommended he be admitted and started on IV steroids and IV antibiotics. He was given IV decadron and Clindamycin with significant improvement over the course of a day. He was able to tolerate PO intake well and his pain greatly improved. His white blood cell count began to trend down and he remained afebrile and was felt stable enough to discharge home.   Asymptomatic severe uncontrolled hypertension - patient presented with asymptomatic uncontrolled hypertension wit systolics greater than 200 and diastolic pressures grater than 100;. He did not report any lightheadedness, chest pain, abd pain, or chain in urine appearance. UDS was performed and was positive for THC and cocaine. He was started on lisinopril and hydrochlorothiazide with slow improvement in his blood pressures.  Covid -19 Patient was noted to be COVID-19 positive on admission. He was not requiring any supplemental oxygen at the time of admission. He received IV decadron in the ED for his tonsillitis. He did not require any additional interventions. Airborne and contact precautions were enforced.   Hypokalemia - patient was noted to have a  potassium of 2.8 on admission. He received KDUR as well as IV potassium with improvement in is levels.   Discharge Exam:   BP (!) 161/103 (BP Location: Left Arm)   Pulse 70   Temp 97.9 F (36.6 C) (Oral)   Resp 18   SpO2 99%  Discharge exam: Physical Exam Constitutional:      Appearance: He is well-developed and normal weight.  HENT:     Head: Atraumatic.     Mouth/Throat:     Mouth: Mucous membranes are moist. No oral lesions.     Pharynx: Pharyngeal swelling and posterior oropharyngeal erythema present. No oropharyngeal exudate.     Tonsils: 2+ on the left.  Cardiovascular:     Rate and Rhythm: Normal rate and regular rhythm.  Pulmonary:     Effort: Pulmonary effort is normal.     Breath sounds: Normal breath sounds.  Musculoskeletal:     Cervical back: Normal range of motion and neck supple.  Skin:    General: Skin is warm and dry.  Neurological:     General: No focal deficit present.     Mental Status: He is alert and oriented to person, place, and time.  Psychiatric:        Mood and Affect: Mood normal.        Behavior: Behavior normal.     Pertinent  Labs, Studies, and Procedures:  CT soft tissue neck: Tonsillitis with phlegmon but no discrete/drainable peritonsillar abscess at this time. Inflammatory changes extend inferiorly to the level of the hypopharynx. Small retropharyngeal effusion is present. Associated airway narrowing and reactive adenopathy.  EKG: Normal sinus, probable left vent hypertrophy, prolonged QTc  Discharge Instructions: Discharge Instructions     Call MD for:  difficulty breathing, headache or visual disturbances   Complete by: As directed    Call MD for:  extreme fatigue   Complete by: As directed    Call MD for:  persistant dizziness or light-headedness   Complete by: As directed    Call MD for:  persistant nausea and vomiting   Complete by: As directed    Call MD for:  redness, tenderness, or signs of infection (pain, swelling,  redness, odor or green/yellow discharge around incision site)   Complete by: As directed    Call MD for:  severe uncontrolled pain   Complete by: As directed    Call MD for:  temperature >100.4   Complete by: As directed    Diet - low sodium heart healthy   Complete by: As directed    Diet - low sodium heart healthy   Complete by: As directed    Increase activity slowly   Complete by: As directed       You were admitted to [facility] for treatment of tonsillitis. You were started on the IV antibiotic Clindamycin as well as IV steroids. On discharge, you will be given the oral form of this medication. Please take the Clindamycin 300mg  every 8 hours for 8 days, until 07/26/21.  You were also noted to to have high blood pressure on admission. Please ensure that you take the hydrochlorothiazide 25mg  each day as well as the lisinopril 10mg  each day as well. Please also ensure that you avoid future cocaine use as this could exacerbate your high blood pressure.  Lastly, you were also found to be COVID-19 positive during this admission. Please continue to isolate and wear a mask when around others. Please follow up with a primary care physician in 1 week.   Signed: 09/25/21, MD 07/18/2021, 5:59 PM   Pager: [phone]

## 2021-07-18 NOTE — Progress Notes (Signed)
Discharge instructions reviewed with pt and prescription medications delivered via [facility]. Pt verbalized understanding and had no questions.  Pt discharged in stable condition.

## 2021-07-18 NOTE — Discharge Instructions (Addendum)
Dear Jeremiah Curry,  You were admitted to [facility] for treatment of tonsillitis. You were started on the IV antibiotic Clindamycin as well as IV steroids. On discharge, you will be given the oral form of this medication. Please take the Clindamycin 300mg  every 8 hours for 8 days, until 07/26/21.  You were also noted to to have high blood pressure on admission. Please ensure that you take the hydrochlorothiazide 25mg  each day as well as the lisinopril 10mg  each day as well. Please also ensure that you avoid future cocaine use as this could exacerbate your high blood pressure.  Lastly, you were also found to be COVID-19 positive during this admission. Please continue to isolate and wear a mask when around others. Please follow up with a primary care physician in 1 week.

## 2021-07-18 NOTE — ED Notes (Signed)
Offered pt lidocaine for pain in throat.  Pt declines at this time stating "i'm ok right now"

## 2021-07-18 NOTE — ED Notes (Signed)
Pt given lip balm  Denies other needs at this time

## 2021-07-18 NOTE — TOC Transition Note (Signed)
Transition of Care [facility]) - CM/SW Discharge Note   Patient Details  Name: Jeremiah Curry MRN: [id number] Date of Birth: 08-06-1976  [facility] CM/SW Contact:  Leone Haven, RN Phone Number: 07/18/2021, 4:52 PM   Clinical Narrative:    Patient is for dc today, TOC filled his meds and brought them to the room.  NCM made follow up virtual apt for patient at the [facility] , listed on AVS.   Final next level of care: Home/Self Care Barriers to Discharge: No Barriers Identified   Patient Goals and CMS Choice Patient states their goals for this hospitalization and ongoing recovery are:: return home      Discharge Placement                       Discharge Plan and Services                                     Social Determinants of Health (SDOH) Interventions     Readmission Risk Interventions No flowsheet data found.

## 2021-07-18 NOTE — ED Notes (Signed)
Breakfast Order Placed ?

## 2021-07-21 LAB — ALDOSTERONE + RENIN ACTIVITY W/ RATIO
ALDO / PRA Ratio: <1.3 (ref 0.0–30.0)
Aldosterone: <1 ng/dL (ref 0.0–30.0)
PRA LC/MS/MS: 0.747 ng/mL/hr (ref 0.167–5.380)

## 2021-08-29 ENCOUNTER — Inpatient Hospital Stay (INDEPENDENT_AMBULATORY_CARE_PROVIDER_SITE_OTHER): Payer: Self-pay | Admitting: Primary Care

## 2022-10-04 ENCOUNTER — Other Ambulatory Visit (HOSPITAL_COMMUNITY): Payer: Self-pay

## 2023-06-08 IMAGING — CT CT NECK W/ CM
4 of 6 series · 13 of 33 positions shown, 15 images · IV contrast (omnipaque)
Comparison: None.

CLINICAL DATA: Epiglottitis or tonsillitis suspected; peritonsillar
abscess; sore throat

EXAM:
CT NECK WITH CONTRAST
TECHNIQUE: Multidetector CT imaging of the neck was performed using the
standard protocol following the bolus administration of intravenous
contrast.
CONTRAST:  100mL OMNIPAQUE IOHEXOL 300 MG/ML  SOLN

[Series 3: neck 2.0 st · axial · 0.44mm/px · z∈[+904,+1058]mm · 3 of 155 slices shown, 4 images]
[im 39/155  soft-tissue]
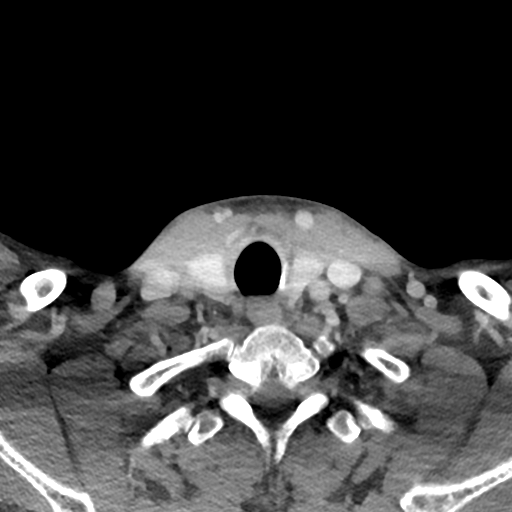
[im 39/155  bone]
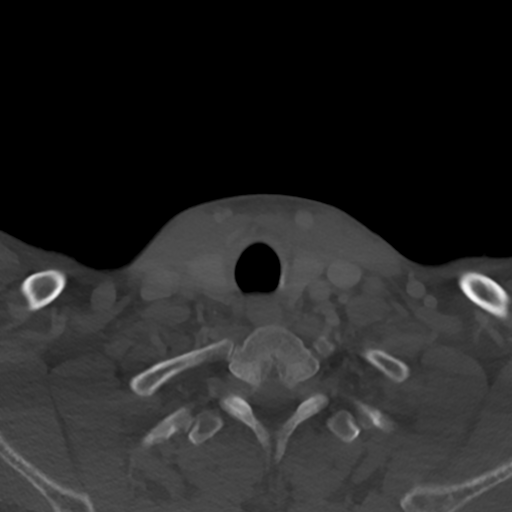
[im 78/155  bone]
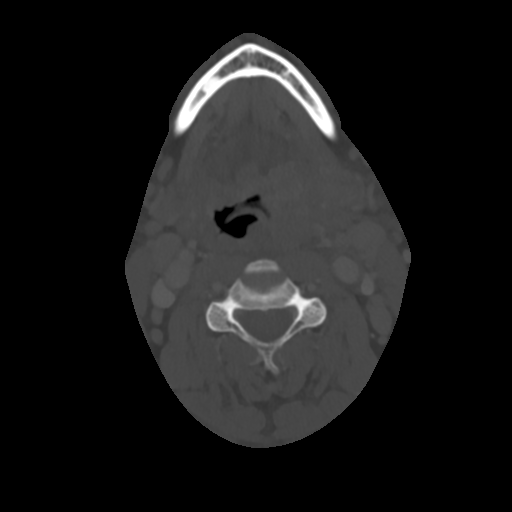
[im 116/155  bone]
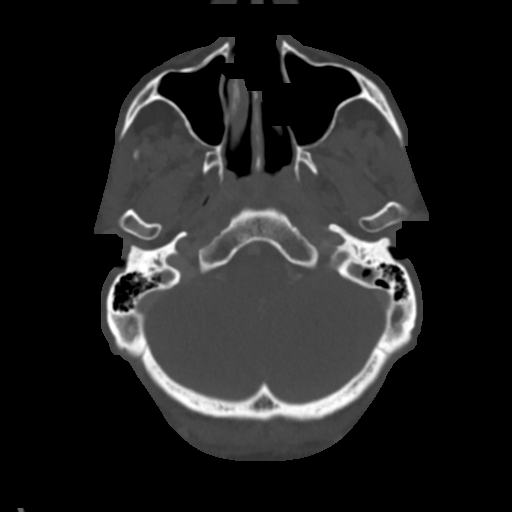

[Series 5: sagittal · sagittal · 0.51mm/px · 5 of 83 slices shown, 6 images]
[im 28/83  bone]
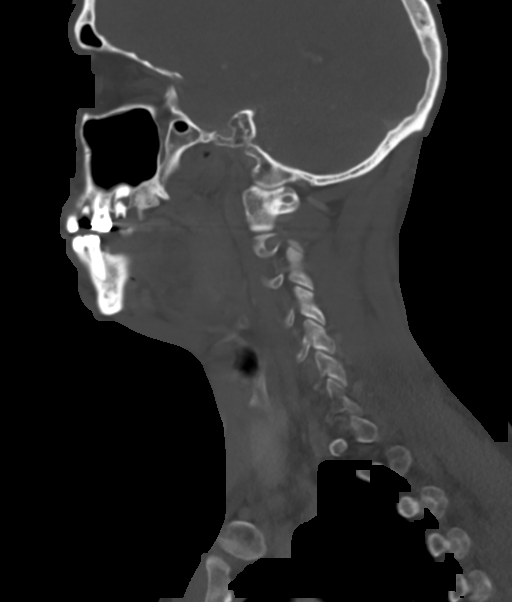
[im 35/83  bone]
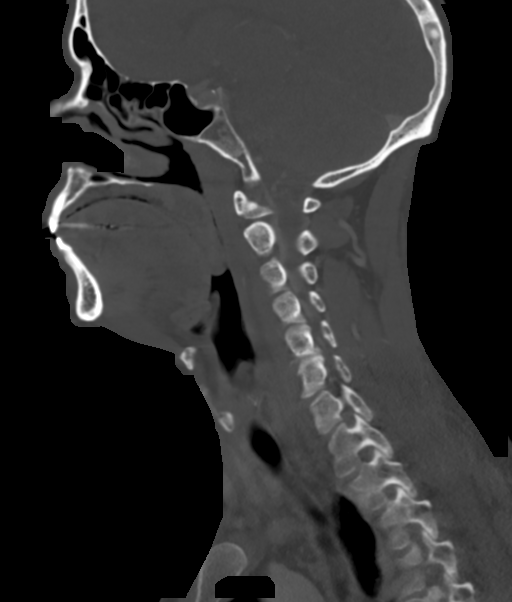
[im 42/83  soft-tissue]
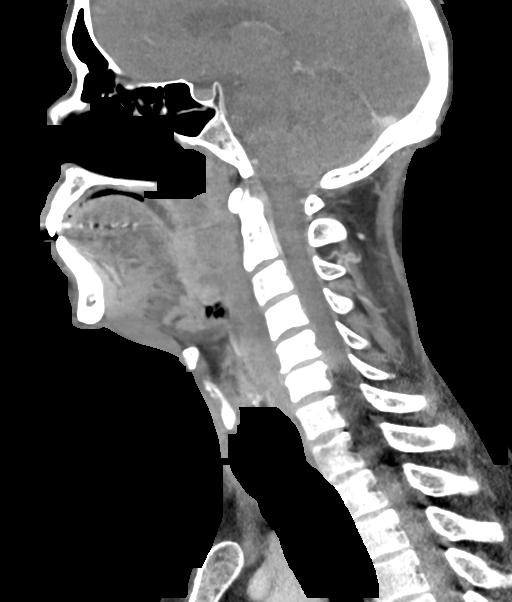
[im 42/83  bone]
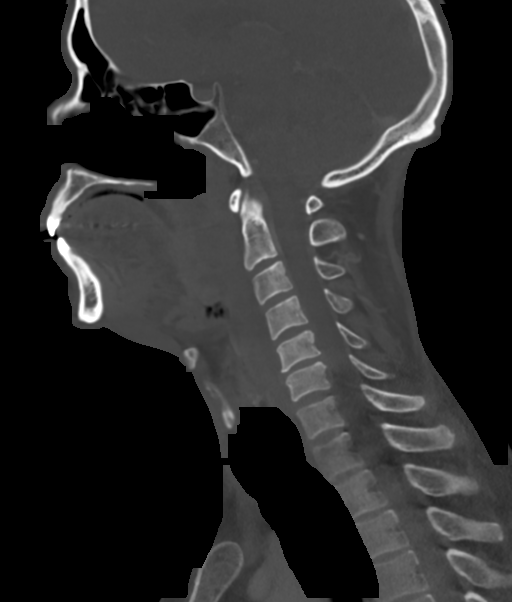
[im 48/83  bone]
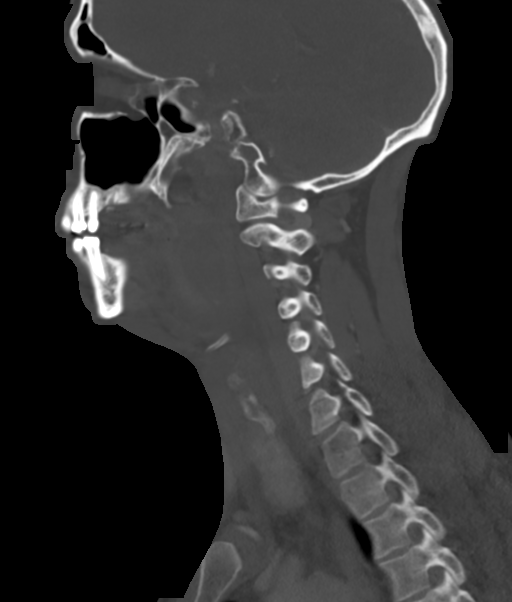
[im 55/83  bone]
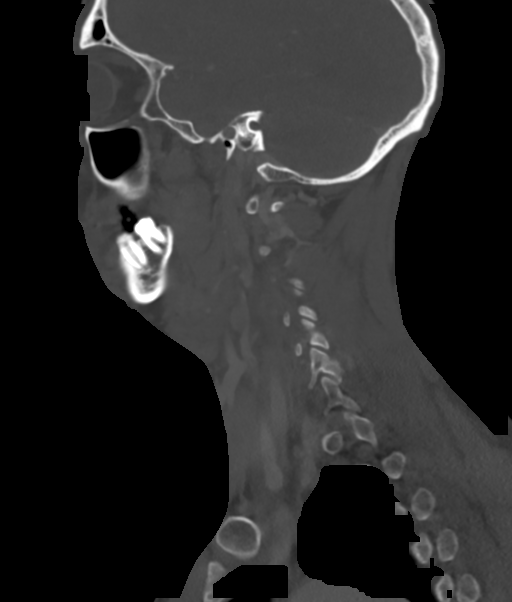

[Series 6: coronal · coronal · 0.38mm/px · 3 of 113 slices shown]
[im 23/113  bone]
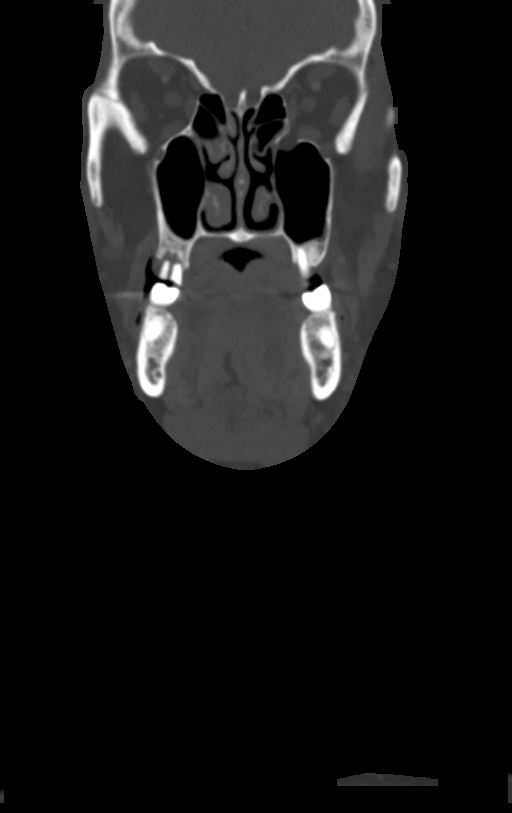
[im 45/113  bone]
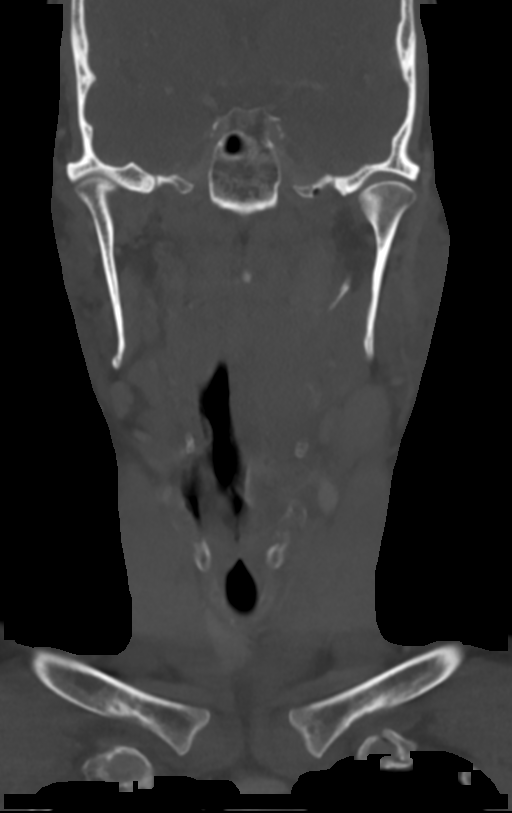
[im 68/113  bone]
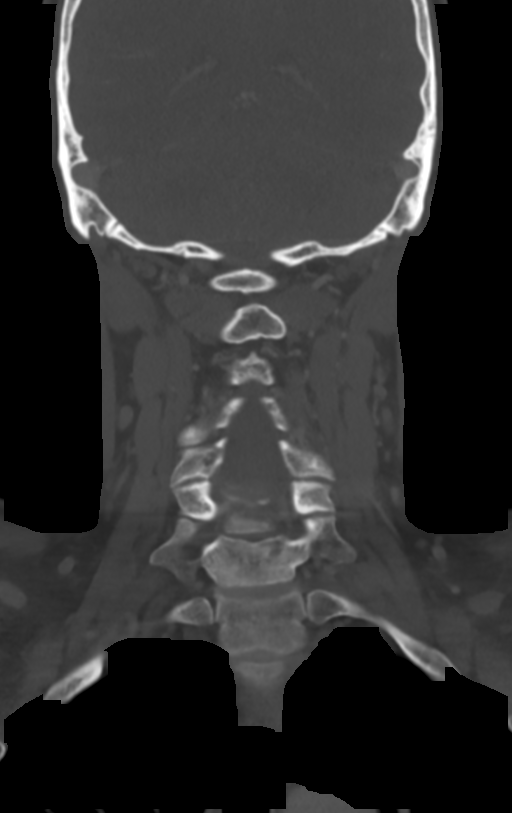

[Series 7: orthogonal · axial · 0.39mm/px · z∈[+930,+1032]mm · 2 of 153 slices shown]
[im 51/153  bone]
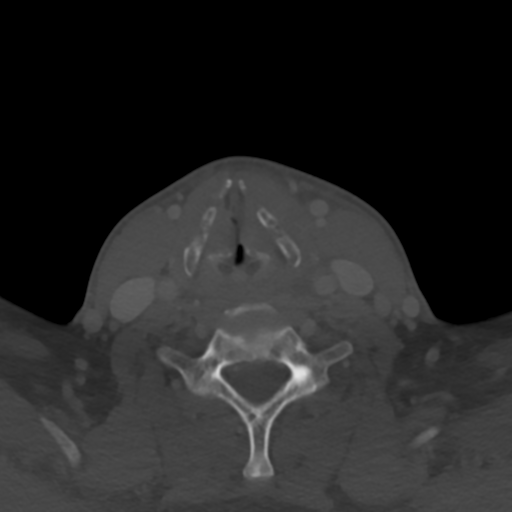
[im 102/153  bone]
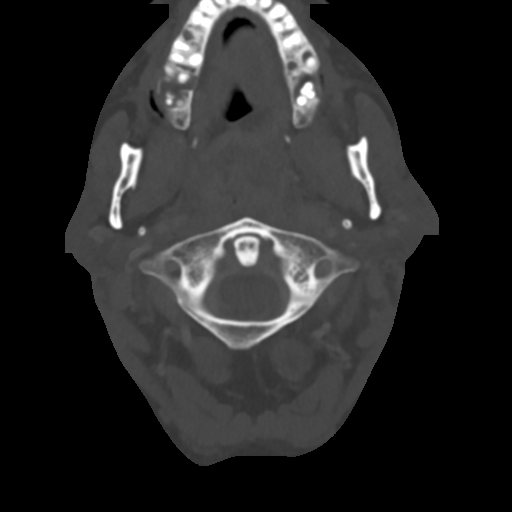

[13 of 33 positions shown; findings below may reference images not displayed]

FINDINGS: Pharynx and larynx: Enlargement of the palatine tonsils, left
greater than right. Ill-defined slightly lower attenuation regions
are present likely reflecting phlegmon without discrete drainable
abscess at this time. Associated narrowing of the airway. Pharyngeal
wall swelling extends inferiorly on the left posteriorly and
laterally to the level of the hypopharynx. There is effacement of
the left piriform sinus. Retropharyngeal effusion is present
measuring approximately 3 mm in thickness. Left greater than right
parapharyngeal inflammatory changes are present.

Salivary glands: Unremarkable. Inflammatory changes in the left
submandibular space related to above.

Thyroid: Normal.

Lymph nodes: Enlarged left level 2 nodes are likely reactive. For
example, 19 mm node on series 3, image 84. Additional asymmetric
nonenlarged left nodes are also likely reactive.

Vascular: Major neck vessels are patent. There is eccentric
noncalcified plaque carotid mild calcified plaque at the common
carotid bifurcations.

Limited intracranial: No abnormal enhancement.

Visualized orbits: Unremarkable.

Mastoids and visualized paranasal sinuses: Mastoid air cells are
clear. Mild paranasal sinus mucosal thickening.

Skeleton: Multiple periapical lucencies about significantly decayed
teeth. Minor degenerative changes of the cervical spine.

Upper chest: Included upper lungs are clear.

Other: None.
IMPRESSION: Tonsillitis with phlegmon but no discrete/drainable peritonsillar
abscess at this time. Inflammatory changes extend inferiorly to the
level of the hypopharynx. Small retropharyngeal effusion is present.
Associated airway narrowing and reactive adenopathy.

## 2024-01-31 ENCOUNTER — Inpatient Hospital Stay (HOSPITAL_COMMUNITY)
Admission: EM | Admit: 2024-01-31 | Discharge: 2024-02-22 | DRG: 871 | Disposition: E | Payer: MEDICAID | Attending: Critical Care Medicine | Admitting: Critical Care Medicine

## 2024-01-31 ENCOUNTER — Other Ambulatory Visit: Payer: Self-pay

## 2024-01-31 ENCOUNTER — Emergency Department (HOSPITAL_COMMUNITY): Payer: MEDICAID

## 2024-01-31 ENCOUNTER — Other Ambulatory Visit (HOSPITAL_COMMUNITY): Payer: Self-pay

## 2024-01-31 ENCOUNTER — Encounter (HOSPITAL_COMMUNITY): Payer: Self-pay

## 2024-01-31 ENCOUNTER — Inpatient Hospital Stay (HOSPITAL_COMMUNITY): Payer: MEDICAID

## 2024-01-31 DIAGNOSIS — R739 Hyperglycemia, unspecified: Secondary | ICD-10-CM | POA: Diagnosis present

## 2024-01-31 DIAGNOSIS — G9340 Encephalopathy, unspecified: Secondary | ICD-10-CM

## 2024-01-31 DIAGNOSIS — J9602 Acute respiratory failure with hypercapnia: Secondary | ICD-10-CM | POA: Diagnosis present

## 2024-01-31 DIAGNOSIS — E876 Hypokalemia: Secondary | ICD-10-CM | POA: Diagnosis present

## 2024-01-31 DIAGNOSIS — R579 Shock, unspecified: Secondary | ICD-10-CM

## 2024-01-31 DIAGNOSIS — F17200 Nicotine dependence, unspecified, uncomplicated: Secondary | ICD-10-CM | POA: Diagnosis present

## 2024-01-31 DIAGNOSIS — D689 Coagulation defect, unspecified: Secondary | ICD-10-CM | POA: Diagnosis present

## 2024-01-31 DIAGNOSIS — J45909 Unspecified asthma, uncomplicated: Secondary | ICD-10-CM | POA: Diagnosis present

## 2024-01-31 DIAGNOSIS — R570 Cardiogenic shock: Secondary | ICD-10-CM | POA: Diagnosis present

## 2024-01-31 DIAGNOSIS — D696 Thrombocytopenia, unspecified: Secondary | ICD-10-CM | POA: Diagnosis present

## 2024-01-31 DIAGNOSIS — E872 Acidosis, unspecified: Secondary | ICD-10-CM

## 2024-01-31 DIAGNOSIS — J9601 Acute respiratory failure with hypoxia: Secondary | ICD-10-CM | POA: Diagnosis present

## 2024-01-31 DIAGNOSIS — R569 Unspecified convulsions: Secondary | ICD-10-CM | POA: Diagnosis present

## 2024-01-31 DIAGNOSIS — Z515 Encounter for palliative care: Secondary | ICD-10-CM

## 2024-01-31 DIAGNOSIS — Z1152 Encounter for screening for COVID-19: Secondary | ICD-10-CM | POA: Diagnosis not present

## 2024-01-31 DIAGNOSIS — A4189 Other specified sepsis: Secondary | ICD-10-CM | POA: Diagnosis present

## 2024-01-31 DIAGNOSIS — E8729 Other acidosis: Secondary | ICD-10-CM | POA: Diagnosis present

## 2024-01-31 DIAGNOSIS — Z66 Do not resuscitate: Secondary | ICD-10-CM | POA: Diagnosis present

## 2024-01-31 DIAGNOSIS — R6521 Severe sepsis with septic shock: Secondary | ICD-10-CM | POA: Diagnosis present

## 2024-01-31 DIAGNOSIS — E875 Hyperkalemia: Secondary | ICD-10-CM | POA: Diagnosis present

## 2024-01-31 DIAGNOSIS — N179 Acute kidney failure, unspecified: Secondary | ICD-10-CM | POA: Diagnosis present

## 2024-01-31 DIAGNOSIS — I469 Cardiac arrest, cause unspecified: Secondary | ICD-10-CM | POA: Diagnosis present

## 2024-01-31 DIAGNOSIS — G9341 Metabolic encephalopathy: Secondary | ICD-10-CM | POA: Diagnosis present

## 2024-01-31 DIAGNOSIS — E781 Pure hyperglyceridemia: Secondary | ICD-10-CM | POA: Diagnosis present

## 2024-01-31 DIAGNOSIS — K7200 Acute and subacute hepatic failure without coma: Secondary | ICD-10-CM | POA: Diagnosis present

## 2024-01-31 DIAGNOSIS — J101 Influenza due to other identified influenza virus with other respiratory manifestations: Secondary | ICD-10-CM | POA: Diagnosis present

## 2024-01-31 LAB — POCT I-STAT 7, (LYTES, BLD GAS, ICA,H+H)
Acid-base deficit: 10 mmol/L — ABNORMAL HIGH (ref 0.0–2.0)
Acid-base deficit: 14 mmol/L — ABNORMAL HIGH (ref 0.0–2.0)
Bicarbonate: 13.1 mmol/L — ABNORMAL LOW (ref 20.0–28.0)
Bicarbonate: 16.5 mmol/L — ABNORMAL LOW (ref 20.0–28.0)
Calcium, Ion: 0.84 mmol/L — CL (ref 1.15–1.40)
Calcium, Ion: 0.85 mmol/L — CL (ref 1.15–1.40)
HCT: 36 % — ABNORMAL LOW (ref 39.0–52.0)
HCT: 39 % (ref 39.0–52.0)
Hemoglobin: 12.2 g/dL — ABNORMAL LOW (ref 13.0–17.0)
Hemoglobin: 13.3 g/dL (ref 13.0–17.0)
O2 Saturation: 100 %
O2 Saturation: 100 %
Patient temperature: 35.6
Patient temperature: 35.9
Potassium: 2.9 mmol/L — ABNORMAL LOW (ref 3.5–5.1)
Potassium: 3.2 mmol/L — ABNORMAL LOW (ref 3.5–5.1)
Sodium: 137 mmol/L (ref 135–145)
Sodium: 139 mmol/L (ref 135–145)
TCO2: 14 mmol/L — ABNORMAL LOW (ref 22–32)
TCO2: 18 mmol/L — ABNORMAL LOW (ref 22–32)
pCO2 arterial: 33.2 mm[Hg] (ref 32–48)
pCO2 arterial: 37.4 mm[Hg] (ref 32–48)
pH, Arterial: 7.198 — CL (ref 7.35–7.45)
pH, Arterial: 7.246 — ABNORMAL LOW (ref 7.35–7.45)
pO2, Arterial: 302 mm[Hg] — ABNORMAL HIGH (ref 83–108)
pO2, Arterial: 507 mm[Hg] — ABNORMAL HIGH (ref 83–108)

## 2024-01-31 LAB — I-STAT ARTERIAL BLOOD GAS, ED
Acid-base deficit: 20 mmol/L — ABNORMAL HIGH (ref 0.0–2.0)
Bicarbonate: 11 mmol/L — ABNORMAL LOW (ref 20.0–28.0)
Calcium, Ion: 1.03 mmol/L — ABNORMAL LOW (ref 1.15–1.40)
HCT: 44 % (ref 39.0–52.0)
Hemoglobin: 15 g/dL (ref 13.0–17.0)
O2 Saturation: 100 %
Patient temperature: 96
Potassium: 3.4 mmol/L — ABNORMAL LOW (ref 3.5–5.1)
Sodium: 134 mmol/L — ABNORMAL LOW (ref 135–145)
TCO2: 12 mmol/L — ABNORMAL LOW (ref 22–32)
pCO2 arterial: 43.2 mm[Hg] (ref 32–48)
pH, Arterial: 7.004 — CL (ref 7.35–7.45)
pO2, Arterial: 356 mm[Hg] — ABNORMAL HIGH (ref 83–108)

## 2024-01-31 LAB — PHOSPHORUS: Phosphorus: >30 mg/dL — ABNORMAL HIGH (ref 2.5–4.6)

## 2024-01-31 LAB — COMPREHENSIVE METABOLIC PANEL
ALT: 936 U/L — ABNORMAL HIGH (ref 0–44)
AST: 2056 U/L — ABNORMAL HIGH (ref 15–41)
Albumin: 2 g/dL — ABNORMAL LOW (ref 3.5–5.0)
Alkaline Phosphatase: 98 U/L (ref 38–126)
Anion gap: 30 — ABNORMAL HIGH (ref 5–15)
BUN: 43 mg/dL — ABNORMAL HIGH (ref 6–20)
CO2: 14 mmol/L — ABNORMAL LOW (ref 22–32)
Calcium: 6.8 mg/dL — ABNORMAL LOW (ref 8.9–10.3)
Chloride: 95 mmol/L — ABNORMAL LOW (ref 98–111)
Creatinine, Ser: 5.05 mg/dL — ABNORMAL HIGH (ref 0.61–1.24)
GFR, Estimated: 14 mL/min — ABNORMAL LOW (ref >60)
Glucose, Bld: 74 mg/dL (ref 70–99)
Potassium: 2.8 mmol/L — ABNORMAL LOW (ref 3.5–5.1)
Sodium: 139 mmol/L (ref 135–145)
Total Bilirubin: 2.3 mg/dL — ABNORMAL HIGH (ref 0.0–1.2)
Total Protein: 6.1 g/dL — ABNORMAL LOW (ref 6.5–8.1)

## 2024-01-31 LAB — RESPIRATORY PANEL BY PCR

## 2024-01-31 LAB — HEMOGLOBIN A1C
Hgb A1c MFr Bld: 5 % (ref 4.8–5.6)
Mean Plasma Glucose: 96.8 mg/dL

## 2024-01-31 LAB — CBC
HCT: 40.3 % (ref 39.0–52.0)
HCT: 37.4 % — ABNORMAL LOW (ref 39.0–52.0)
Hemoglobin: 13.4 g/dL (ref 13.0–17.0)
Hemoglobin: 13 g/dL (ref 13.0–17.0)
MCH: 36.5 pg — ABNORMAL HIGH (ref 26.0–34.0)
MCH: 35.9 pg — ABNORMAL HIGH (ref 26.0–34.0)
MCHC: 33.3 g/dL (ref 30.0–36.0)
MCHC: 34.8 g/dL (ref 30.0–36.0)
MCV: 109.8 fL — ABNORMAL HIGH (ref 80.0–100.0)
MCV: 103.3 fL — ABNORMAL HIGH (ref 80.0–100.0)
Platelets: 11 10*3/uL — CL (ref 150–400)
Platelets: 38 10*3/uL — ABNORMAL LOW (ref 150–400)
RBC: 3.67 MIL/uL — ABNORMAL LOW (ref 4.22–5.81)
RBC: 3.62 MIL/uL — ABNORMAL LOW (ref 4.22–5.81)
RDW: 16 % — ABNORMAL HIGH (ref 11.5–15.5)
RDW: 16.5 % — ABNORMAL HIGH (ref 11.5–15.5)
WBC: 15.8 10*3/uL — ABNORMAL HIGH (ref 4.0–10.5)
WBC: 7.6 10*3/uL (ref 4.0–10.5)
nRBC: 2.9 % — ABNORMAL HIGH (ref 0.0–0.2)
nRBC: 5.4 % — ABNORMAL HIGH (ref 0.0–0.2)

## 2024-01-31 LAB — RAPID URINE DRUG SCREEN, HOSP PERFORMED
Amphetamines: NOT DETECTED
Barbiturates: NOT DETECTED
Benzodiazepines: NOT DETECTED
Cocaine: POSITIVE — AB
Opiates: NOT DETECTED
Tetrahydrocannabinol: POSITIVE — AB

## 2024-01-31 LAB — I-STAT CHEM 8, ED
BUN: 62 mg/dL — ABNORMAL HIGH (ref 6–20)
Calcium, Ion: 0.99 mmol/L — ABNORMAL LOW (ref 1.15–1.40)
Chloride: 103 mmol/L (ref 98–111)
Creatinine, Ser: 5.8 mg/dL — ABNORMAL HIGH (ref 0.61–1.24)
Glucose, Bld: 88 mg/dL (ref 70–99)
HCT: 47 % (ref 39.0–52.0)
Hemoglobin: 16 g/dL (ref 13.0–17.0)
Potassium: 3 mmol/L — ABNORMAL LOW (ref 3.5–5.1)
Sodium: 137 mmol/L (ref 135–145)
TCO2: 15 mmol/L — ABNORMAL LOW (ref 22–32)

## 2024-01-31 LAB — I-STAT CG4 LACTIC ACID, ED: Lactic Acid, Venous: 14.4 mmol/L (ref 0.5–1.9)

## 2024-01-31 LAB — DIC (DISSEMINATED INTRAVASCULAR COAGULATION)PANEL
D-Dimer, Quant: >20 ug{FEU}/mL — ABNORMAL HIGH (ref 0.00–0.50)
Fibrinogen: 296 mg/dL (ref 210–475)
INR: 2.1 — ABNORMAL HIGH (ref 0.8–1.2)
Platelets: 38 10*3/uL — ABNORMAL LOW (ref 150–400)
Prothrombin Time: 23.4 s — ABNORMAL HIGH (ref 11.4–15.2)
Smear Review: NONE SEEN
aPTT: 56 s — ABNORMAL HIGH (ref 24–36)

## 2024-01-31 LAB — PROTIME-INR
INR: 1.7 — ABNORMAL HIGH (ref 0.8–1.2)
Prothrombin Time: 20.5 s — ABNORMAL HIGH (ref 11.4–15.2)

## 2024-01-31 LAB — MAGNESIUM: Magnesium: 2.2 mg/dL (ref 1.7–2.4)

## 2024-01-31 LAB — TROPONIN I (HIGH SENSITIVITY)
Troponin I (High Sensitivity): 2504 ng/L (ref <18)
Troponin I (High Sensitivity): 2975 ng/L (ref <18)

## 2024-01-31 LAB — ABO/RH: ABO/RH(D): A POS

## 2024-01-31 LAB — GLUCOSE, CAPILLARY
Glucose-Capillary: 105 mg/dL — ABNORMAL HIGH (ref 70–99)
Glucose-Capillary: 155 mg/dL — ABNORMAL HIGH (ref 70–99)

## 2024-01-31 LAB — APTT: aPTT: 56 s — ABNORMAL HIGH (ref 24–36)

## 2024-01-31 LAB — PROCALCITONIN: Procalcitonin: 6.55 ng/mL

## 2024-01-31 LAB — HIV ANTIBODY (ROUTINE TESTING W REFLEX): HIV Screen 4th Generation wRfx: NONREACTIVE

## 2024-01-31 LAB — CG4 I-STAT (LACTIC ACID)
Lactic Acid, Venous: 13 mmol/L (ref 0.5–1.9)
Lactic Acid, Venous: 13.6 mmol/L (ref 0.5–1.9)

## 2024-01-31 LAB — HEMOGLOBIN AND HEMATOCRIT, BLOOD
HCT: 35.5 % — ABNORMAL LOW (ref 39.0–52.0)
Hemoglobin: 12.4 g/dL — ABNORMAL LOW (ref 13.0–17.0)

## 2024-01-31 LAB — TRIGLYCERIDES: Triglycerides: 200 mg/dL — ABNORMAL HIGH (ref <150)

## 2024-01-31 LAB — SARS CORONAVIRUS 2 BY RT PCR: SARS Coronavirus 2 by RT PCR: NEGATIVE

## 2024-01-31 LAB — PREPARE RBC (CROSSMATCH)

## 2024-01-31 LAB — CBG MONITORING, ED: Glucose-Capillary: 91 mg/dL (ref 70–99)

## 2024-01-31 LAB — BRAIN NATRIURETIC PEPTIDE: B Natriuretic Peptide: 3471.7 pg/mL — ABNORMAL HIGH (ref 0.0–100.0)

## 2024-01-31 MED ORDER — FOLIC ACID 1 MG PO TABS: 1.0000 mg | ORAL_TABLET | Freq: Every day | ORAL | Status: DC

## 2024-01-31 MED ORDER — EPINEPHRINE HCL 5 MG/250ML IV SOLN IN NS
0.5000 ug/min | INTRAVENOUS | Status: DC
  Administered 2024-01-31: 8 ug/min via INTRAVENOUS
  Administered 2024-01-31: 10 ug/min via INTRAVENOUS
  Administered 2024-02-01: 40 ug/min via INTRAVENOUS
  Filled 2024-01-31 (×3): qty 250

## 2024-01-31 MED ORDER — DOCUSATE SODIUM 50 MG/5ML PO LIQD: 100.0000 mg | Freq: Two times a day (BID) | ORAL | Status: DC

## 2024-01-31 MED ORDER — FENTANYL 2500MCG IN NS 250ML (10MCG/ML) PREMIX INFUSION
50.0000 ug/h | INTRAVENOUS | Status: DC
  Administered 2024-01-31: 50 ug/h via INTRAVENOUS
  Filled 2024-01-31: qty 250

## 2024-01-31 MED ORDER — FENTANYL CITRATE PF 50 MCG/ML IJ SOSY: 50.0000 ug | PREFILLED_SYRINGE | Freq: Once | INTRAMUSCULAR | Status: DC

## 2024-01-31 MED ORDER — HEPARIN SODIUM (PORCINE) 1000 UNIT/ML DIALYSIS
1000.0000 [IU] | INTRAMUSCULAR | Status: DC | PRN
  Administered 2024-01-31: 2400 [IU] via INTRAVENOUS_CENTRAL
  Filled 2024-01-31: qty 3
  Filled 2024-01-31 (×2): qty 6
  Filled 2024-01-31: qty 3

## 2024-01-31 MED ORDER — POLYETHYLENE GLYCOL 3350 17 G PO PACK: 17.0000 g | PACK | Freq: Every day | ORAL | Status: DC

## 2024-01-31 MED ORDER — POTASSIUM CHLORIDE 10 MEQ/100ML IV SOLN
10.0000 meq | INTRAVENOUS | Status: AC
  Administered 2024-01-31 – 2024-02-01 (×6): 10 meq via INTRAVENOUS
  Filled 2024-01-31 (×5): qty 100

## 2024-01-31 MED ORDER — PROPOFOL 1000 MG/100ML IV EMUL
0.0000 ug/kg/min | INTRAVENOUS | Status: DC
  Administered 2024-01-31: 10 ug/kg/min via INTRAVENOUS
  Administered 2024-02-01: 20 ug/kg/min via INTRAVENOUS
  Filled 2024-01-31 (×2): qty 100

## 2024-01-31 MED ORDER — EPINEPHRINE 1 MG/10ML IJ SOSY
PREFILLED_SYRINGE | INTRAMUSCULAR | Status: AC | PRN
  Administered 2024-01-31: 1 mg via INTRAVENOUS

## 2024-01-31 MED ORDER — INSULIN ASPART 100 UNIT/ML IJ SOLN
0.0000 [IU] | INTRAMUSCULAR | Status: DC
  Administered 2024-01-31: 2 [IU] via SUBCUTANEOUS
  Administered 2024-02-01: 1 [IU] via SUBCUTANEOUS

## 2024-01-31 MED ORDER — DOCUSATE SODIUM 100 MG PO CAPS: 100.0000 mg | ORAL_CAPSULE | Freq: Two times a day (BID) | ORAL | Status: DC | PRN

## 2024-01-31 MED ORDER — IOHEXOL 350 MG/ML SOLN
65.0000 mL | Freq: Once | INTRAVENOUS | Status: AC | PRN
  Administered 2024-01-31: 65 mL via INTRAVENOUS

## 2024-01-31 MED ORDER — NOREPINEPHRINE 4 MG/250ML-% IV SOLN
0.0000 ug/min | INTRAVENOUS | Status: DC
  Administered 2024-01-31: 10 ug/min via INTRAVENOUS
  Administered 2024-01-31: 8 ug/min via INTRAVENOUS
  Filled 2024-01-31 (×2): qty 250

## 2024-01-31 MED ORDER — THIAMINE MONONITRATE 100 MG PO TABS: 100.0000 mg | ORAL_TABLET | ORAL | Status: DC

## 2024-01-31 MED ORDER — FENTANYL BOLUS VIA INFUSION
50.0000 ug | INTRAVENOUS | Status: DC | PRN
  Administered 2024-02-01 (×2): 50 ug via INTRAVENOUS

## 2024-01-31 MED ORDER — PANTOPRAZOLE SODIUM 40 MG IV SOLR
40.0000 mg | Freq: Two times a day (BID) | INTRAVENOUS | Status: DC
  Administered 2024-01-31: 40 mg via INTRAVENOUS
  Filled 2024-01-31: qty 10

## 2024-01-31 MED ORDER — SODIUM CHLORIDE 0.9% IV SOLUTION: Freq: Once | INTRAVENOUS | Status: AC

## 2024-01-31 MED ORDER — VANCOMYCIN HCL 1750 MG/350ML IV SOLN
1750.0000 mg | Freq: Once | INTRAVENOUS | Status: AC
  Administered 2024-01-31: 1750 mg via INTRAVENOUS
  Filled 2024-01-31 (×2): qty 350

## 2024-01-31 MED ORDER — VECURONIUM BROMIDE 10 MG IV SOLR
INTRAVENOUS | Status: AC
  Administered 2024-02-01: 10 mg via INTRAVENOUS
  Filled 2024-01-31: qty 10

## 2024-01-31 MED ORDER — SODIUM CHLORIDE 0.9 % IV SOLN: INTRAVENOUS | Status: DC | PRN

## 2024-01-31 MED ORDER — HYDROCORTISONE SOD SUC (PF) 100 MG IJ SOLR
100.0000 mg | Freq: Two times a day (BID) | INTRAMUSCULAR | Status: DC
  Administered 2024-01-31 – 2024-02-01 (×2): 100 mg via INTRAVENOUS
  Filled 2024-01-31 (×2): qty 2

## 2024-01-31 MED ORDER — STERILE WATER FOR INJECTION IV SOLN
INTRAVENOUS | Status: AC
  Filled 2024-01-31 (×2): qty 1000

## 2024-01-31 MED ORDER — SODIUM CHLORIDE 0.9 % FOR CRRT: INTRAVENOUS_CENTRAL | Status: DC | PRN

## 2024-01-31 MED ORDER — SODIUM CHLORIDE 0.9 % IV SOLN
2.0000 g | Freq: Once | INTRAVENOUS | Status: AC
  Administered 2024-01-31: 2 g via INTRAVENOUS
  Filled 2024-01-31: qty 12.5

## 2024-01-31 MED ORDER — FENTANYL 2500MCG IN NS 250ML (10MCG/ML) PREMIX INFUSION
50.0000 ug/h | INTRAVENOUS | Status: DC
  Administered 2024-01-31: 25 ug/h via INTRAVENOUS

## 2024-01-31 MED ORDER — SODIUM BICARBONATE 8.4 % IV SOLN
100.0000 meq | Freq: Once | INTRAVENOUS | Status: AC
  Administered 2024-01-31: 200 meq via INTRAVENOUS
  Filled 2024-01-31: qty 200

## 2024-01-31 MED ORDER — CALCIUM GLUCONATE-NACL 1-0.675 GM/50ML-% IV SOLN
1.0000 g | Freq: Once | INTRAVENOUS | Status: AC
  Administered 2024-01-31: 1000 mg via INTRAVENOUS
  Filled 2024-01-31: qty 50

## 2024-01-31 MED ORDER — SODIUM CHLORIDE 0.9 % IV SOLN
2.0000 g | Freq: Two times a day (BID) | INTRAVENOUS | Status: DC
  Administered 2024-02-01: 2 g via INTRAVENOUS
  Filled 2024-01-31: qty 20

## 2024-01-31 MED ORDER — SODIUM CHLORIDE 0.9 % IV BOLUS (SEPSIS)
1000.0000 mL | Freq: Once | INTRAVENOUS | Status: AC
  Administered 2024-01-31: 1000 mL via INTRAVENOUS

## 2024-01-31 MED ORDER — VECURONIUM BROMIDE 10 MG IV SOLR
10.0000 mg | INTRAVENOUS | Status: DC | PRN
  Administered 2024-01-31: 10 mg via INTRAVENOUS
  Filled 2024-01-31: qty 10

## 2024-01-31 MED ORDER — VANCOMYCIN HCL IN DEXTROSE 1-5 GM/200ML-% IV SOLN: 1000.0000 mg | INTRAVENOUS | Status: DC

## 2024-01-31 MED ORDER — ADULT MULTIVITAMIN W/MINERALS CH: 1.0000 | ORAL_TABLET | Freq: Every day | ORAL | Status: DC

## 2024-01-31 MED ORDER — SODIUM CHLORIDE 0.9 % IV SOLN
100.0000 mg | Freq: Two times a day (BID) | INTRAVENOUS | Status: DC
  Administered 2024-01-31: 100 mg via INTRAVENOUS
  Filled 2024-01-31 (×2): qty 100

## 2024-01-31 MED ORDER — FAMOTIDINE 20 MG PO TABS: 20.0000 mg | ORAL_TABLET | Freq: Two times a day (BID) | ORAL | Status: DC

## 2024-01-31 MED ORDER — VASOPRESSIN 20 UNITS/100 ML INFUSION FOR SHOCK
0.0000 [IU]/min | INTRAVENOUS | Status: DC
  Administered 2024-02-01: 0.04 [IU]/min via INTRAVENOUS
  Administered 2024-02-01: 0.03 [IU]/min via INTRAVENOUS
  Filled 2024-01-31 (×2): qty 100

## 2024-01-31 MED ORDER — PRISMASOL BGK 4/2.5 32-4-2.5 MEQ/L EC SOLN: Status: DC

## 2024-01-31 MED ORDER — POLYETHYLENE GLYCOL 3350 17 G PO PACK: 17.0000 g | PACK | Freq: Every day | ORAL | Status: DC | PRN

## 2024-01-31 MED ORDER — THIAMINE HCL 100 MG/ML IJ SOLN
200.0000 mg | INTRAVENOUS | Status: DC
  Administered 2024-01-31: 200 mg via INTRAVENOUS
  Filled 2024-01-31 (×2): qty 2

## 2024-01-31 MED ORDER — STERILE WATER FOR INJECTION IV SOLN
INTRAVENOUS | Status: DC
  Filled 2024-01-31 (×4): qty 150

## 2024-01-31 MED ORDER — FENTANYL BOLUS VIA INFUSION
50.0000 ug | INTRAVENOUS | Status: DC | PRN
  Administered 2024-01-31: 100 ug via INTRAVENOUS

## 2024-01-31 MED ORDER — LEVETIRACETAM IN NACL 1500 MG/100ML IV SOLN
3000.0000 mg | Freq: Once | INTRAVENOUS | Status: AC
  Administered 2024-01-31: 3000 mg via INTRAVENOUS
  Filled 2024-01-31: qty 200

## 2024-01-31 MED ORDER — SODIUM CHLORIDE 0.9 % IV SOLN: 1000.0000 mL | INTRAVENOUS | Status: DC

## 2024-01-31 NOTE — Procedures (Signed)
 Arterial Line Insertion Start/End2/06/2024 5:19 PM, 01/31/2024 5:25 PM  Patient location: ED. Preanesthetic checklist: patient identified, IV checked, site marked, risks and benefits discussed, surgical consent, monitors and equipment checked, pre-op evaluation and timeout performed Left, radial was placed Catheter size: 20 G Hand hygiene performed  and maximum sterile barriers used  Allen's test indicative of satisfactory collateral circulation Attempts: 1 Procedure performed without using ultrasound guided technique. Following insertion, dressing applied. Patient tolerated the procedure well with no immediate complications.

## 2024-01-31 NOTE — Progress Notes (Signed)
 Attempted Echocardiogram, spoke with RN, Pt is going to CT first.

## 2024-01-31 NOTE — Progress Notes (Addendum)
 Pharmacy Antibiotic Note  Jeremiah Curry is a 48 y.o. male for which pharmacy has been consulted for ceftriaxone  and vancomycin  dosing for pneumonia, bacteremia, and meningitis. Patient arrives as a cardiac arrest. Positive for cough, SOB, diarrhea (reported family w/ flu).  SCr 5.8 WBC pending; LA 14.4; HR 55; RR 26  Plan: Cefepime  x 1 in ED >> Ceftriaxone  2g q12hr Vancomycin  1750 mg once, subsequent dosing as indicated per random vancomycin  level until renal function stable and/or improved, at which time scheduled dosing can be considered Monitor WBC, fever, renal function, cultures De-escalate when able F/u Nephrology plan  Height: 6' 6 (198.1 cm) IBW/kg (Calculated) : 91.4  No data recorded.  Recent Labs  Lab 01/31/24 1648  CREATININE 5.80*  LATICACIDVEN 14.4*    CrCl cannot be calculated (Unknown ideal weight.).    No Known Allergies  Microbiology results: Pending  Thank you for allowing pharmacy to be a part of this patient's care.  Dorn Buttner, PharmD, BCPS 01/31/2024 6:03 PM ED Clinical Pharmacist -  [phone]

## 2024-01-31 NOTE — Progress Notes (Signed)
 01/31/2024 Benefits of IV contrasts outweigh risks.  Ardelle Kos MD PCCM

## 2024-01-31 NOTE — Procedures (Signed)
 Central Venous Catheter Insertion Procedure Note  BREYTON VANSCYOC  [id number]  1976-02-06  Date:01/31/24  Time:9:37 PM   Provider Performing:Kinzy Weyers D Emilio   Procedure: Insertion of Non-tunneled Central Venous Catheter(36556) with US  guidance (23062)    Indication(s) Medication administration  Consent Risks of the procedure as well as the alternatives and risks of each were explained to the patient and/or caregiver.  Consent for the procedure was obtained and is signed in the bedside chart  Anesthesia Topical only with 1% lidocaine    Timeout Verified patient identification, verified procedure, site/side was marked, verified correct patient position, special equipment/implants available, medications/allergies/relevant history reviewed, required imaging and test results available.  Sterile Technique Maximal sterile technique including full sterile barrier drape, hand hygiene, sterile gown, sterile gloves, mask, hair covering, sterile ultrasound probe cover (if used).  Procedure Description Area of catheter insertion was cleaned with chlorhexidine  and draped in sterile fashion.  With real-time ultrasound guidance a central venous catheter was placed into the left internal jugular vein. Nonpulsatile blood flow and easy flushing noted in all ports.  The catheter was sutured in place and sterile dressing applied.  Complications/Tolerance None; patient tolerated the procedure well. Chest X-ray is ordered to verify placement for internal jugular or subclavian cannulation.   Chest x-ray is not ordered for femoral cannulation.  EBL Minimal  Specimen(s) None  JD Emilio RIGGERS [facility] 01/31/2024, 9:38 PM  Please see [email or web address] for pager details.  From 7A-7P if no response, please call [phone]. After hours, please call ELink [phone].

## 2024-01-31 NOTE — Progress Notes (Signed)
 Patient transported from ED to CT and from CT to 2H13 without complication.

## 2024-01-31 NOTE — Progress Notes (Signed)
 Patient ID: Jeremiah Curry, male   DOB: November 04, 1976, 47 y.o.   MRN: [id number]  I was asked to provide nephrology approval for CT angiogram study in this patient who is s/p cardiac arrest and with AKI (creatinine 5.8). He is clinically unstable/unsuitable for alternate imaging modalities. The benefit of the study outweighs the risk. I approve iodinated IV contrast- limit volume as much as possible and follow labs.  Gordy Blanch MD [facility]. Office # [phone] Pager # [phone] 5:55 PM

## 2024-01-31 NOTE — H&P (Addendum)
 NAME:  Jeremiah Curry, MRN:  [id number], DOB:  July 02, 1976, LOS: 0 ADMISSION DATE:  01/31/2024, CONSULTATION DATE:  2/7 REFERRING MD:  gardiner, CHIEF COMPLAINT:  cardiac arrest   History of Present Illness:  Usual SOH up until few days PTA. + viral syndrome w/ cough, SOB, diarrhea (family w/ flu). Am on 2/7 mother noted presyncopal episode and worsening weakness w/ trouble leaving rest room.  EMS called. He was hypotensive. Had witnessed seizure like episode once in truck followed by PEA arrest X 15 minutes to ROSC. Placed on epi gtt. Attempted to wean to off, had another episode of PEA arrest req'd one round ACLS.  Initial scr 5.8, K 3, bicarb 15, lactate 14. Abg 7.00/43/356/bicarb 11 PCCM asked to admit  Pertinent  Medical History  etoh  Significant Hospital Events: Including procedures, antibiotic start and stop dates in addition to other pertinent events   2/7 admitted s/p cardiac arrest currently in cardiogenic shock.  Started on broad-spectrum antibiotics CT head and chest obtained pending at the time of admission  Interim History / Subjective:  Unresponsive  Objective   Blood pressure (!) 114/101, pulse (!) 55, resp. rate (!) 27, SpO2 (!) 66%.    Vent Mode: PRVC FiO2 (%):  [100 %] 100 % Set Rate:  [18 bmp] 18 bmp Vt Set:  [560 mL] 560 mL PEEP:  [5 cmH20] 5 cmH20 Plateau Pressure:  [14 cmH20] 14 cmH20  No intake or output data in the 24 hours ending 01/31/24 1734 There were no vitals filed for this visit.  Examination: General: 48 year old male patient currently unresponsive on full ventilator support HENT: Orally intubated no JVD Lungs: Coarse scattered rhonchi, respiratory rate 32 on ventilator currently paralyzed  Cardiovascular: Tachycardic regular rhythm Abdomen: Soft hypoactive Extremities: Cool mottled pulses thready Neuro: GCS 3 GU: Minimal urine output  Resolved Hospital Problem list     Assessment & Plan:  S/p PEA cardiac arrest Etiology not clear.  Cardiac? PE?  Plan CT head CT chest  Circulatory shock s/p cardiac arrest w/ profound lactic acidosis ? Sepsis/ septic shock w/ thrombocytopenia consider also meningococcal infection  Plan Cont tele  Admit to ICU  Epi for MAP > 65 Needs CVL  Correct acidosis adding bicarb gtt R/o PE (neg) Send cultures and PCT Broad-spectrum antibiotics covering for meningococcal as well Sending urine strep Stress dose steroids  Acute hypoxic resp failure s/p cardiac arrest Cxr w/out marked infiltrates, did have possible influenza exposure and at risk for aspiration Plan Full vent support Maximize VE for resp compensation  VAP bundle  PAD protocol  F/u ABG Antibiotics as above  Severe thrombocytopenia Plan Transfuse  Acute metabolic encephalopathy  S/p cardiac arrest w/ significant concern for anoxic injury superimposed on metabolic derangements Plan Supportive care CT head Avoid fever  MAP goal > 65 Keep euglycemic  AKi w/ metabolic acidosis and hyperkalemia Plan Maximize CO Bicarb gtt Lokelma  Serial chems Prob will need renal consult   Hypokalemia Plan Replace and recheck  Best Practice (right click and Reselect all SmartList Selections daily)   Diet/type: NPO DVT prophylaxis SCD Pressure ulcer(s): N/A GI prophylaxis: H2B Lines: Central line, Dialysis Catheter, and Arterial Line Foley:  Yes, and it is still needed Code Status:  full code Last date of multidisciplinary goals of care discussion [pending]  Labs   CBC: Recent Labs  Lab 01/31/24 1648 01/31/24 1726  HGB 16.0 15.0  HCT 47.0 44.0    Basic Metabolic Panel: Recent Labs  Lab 01/31/24 1648  01/31/24 1726  NA 137 134*  K 3.0* 3.4*  CL 103  --   GLUCOSE 88  --   BUN 62*  --   CREATININE 5.80*  --    GFR: CrCl cannot be calculated (Unknown ideal weight.). Recent Labs  Lab 01/31/24 1648  LATICACIDVEN 14.4*    Liver Function Tests: No results for input(s): AST, ALT, ALKPHOS,  BILITOT, PROT, ALBUMIN in the last 168 hours. No results for input(s): LIPASE, AMYLASE in the last 168 hours. No results for input(s): AMMONIA in the last 168 hours.  ABG    Component Value Date/Time   PHART 7.004 (LL) 01/31/2024 1726   PCO2ART 43.2 01/31/2024 1726   PO2ART 356 (H) 01/31/2024 1726   HCO3 11.0 (L) 01/31/2024 1726   TCO2 12 (L) 01/31/2024 1726   ACIDBASEDEF 20.0 (H) 01/31/2024 1726   O2SAT 100 01/31/2024 1726     Coagulation Profile: No results for input(s): INR, PROTIME in the last 168 hours.  Cardiac Enzymes: No results for input(s): CKTOTAL, CKMB, CKMBINDEX, TROPONINI in the last 168 hours.  HbA1C: No results found for: HGBA1C  CBG: Recent Labs  Lab 01/31/24 1623  GLUCAP 91    Review of Systems:   Not able   Past Medical History:  He,  has a past medical history of Asthma.   Surgical History:  History reviewed. No pertinent surgical history.   Social History:   reports that he has been smoking. He has never used smokeless tobacco. He reports current alcohol  use of about 2.0 standard drinks of alcohol  per week. He reports current drug use. Drug: Marijuana.   Family History:  His family history is not on file.   Allergies No Known Allergies   Home Medications  Prior to Admission medications   Medication Sig Start Date End Date Taking? Authorizing Provider  hydrochlorothiazide  (HYDRODIURIL ) 25 MG tablet Take 1 tablet (25 mg total) by mouth daily. 07/19/21 08/18/21  Gawaluck, Greylon, MD  lisinopril  (ZESTRIL ) 10 MG tablet Take 1 tablet (10 mg total) by mouth daily. 07/19/21 08/18/21  Gawaluck, Greylon, MD     Critical care time: 45 min

## 2024-01-31 NOTE — Progress Notes (Signed)
 eLink Physician-Brief Progress Note Patient Name: ROGERICK BALDWIN DOB: 1976/11/12 MRN: [id number]   Date of Service  01/31/2024  HPI/Events of Note  Corrected calcium  8.0 gm / dl.  eICU Interventions  Calcium  gluconate 2 gm iv ordered.        Aviva Wolfer U Nickoli Bagheri 01/31/2024, 11:44 PM

## 2024-01-31 NOTE — Progress Notes (Signed)
 ED Pharmacy Antibiotic Sign Off An antibiotic consult was received from an ED provider for cefepime  and vancomycin  per pharmacy dosing for sepsis. A chart review was completed to assess appropriateness.  The following one time order(s) were placed per pharmacy consult:  cefepime  2000 mg x 1 dose vancomycin  1750 mg x 1 dose  Further antibiotic and/or antibiotic pharmacy consults should be ordered by the admitting provider if indicated.   Thank you for allowing pharmacy to be a part of this patient's care.   Dorn Buttner, PharmD, BCPS 01/31/2024 4:52 PM ED Clinical Pharmacist -  [phone]

## 2024-01-31 NOTE — Consult Note (Signed)
 Reason for Consult: Acute kidney injury, severe metabolic acidosis Referring Physician: Fredia Alderton, MD (CCM)  HPI:  48 year old man with past medical history significant for tobacco use/substance abuse, history of macrocytosis without anemia and previously recorded hypertension (not on therapy).  He was brought to the emergency room via EMS with concerns of what appears to be a viral syndrome with cough/shortness of breath and diarrhea (and positive contact with family member who had influenza) that culminated today with increasing weakness/presyncopal event.  En route to the ER, he had a witnessed seizure-like episode followed by PEA arrest with 15 minutes to ROSC and was started on an epinephrine  drip with another episode of PEA arrest with a single round of ACLS with attempts at weaning of epinephrine .  He was found to have acute kidney injury with a creatinine of 5.8 (last known creatinine around 1.0 back in 2022) and profound metabolic acidosis.  Additionally he was found to have critical thrombocytopenia of 11,000 and prior to being seen had just had an episode of hematochezia.  He is hemodynamically unstable and earlier underwent femoral dialysis catheter placement.  Past Medical History:  Diagnosis Date   Asthma     History reviewed. No pertinent surgical history.  History reviewed. No pertinent family history.  Social History:  reports that he has been smoking. He has never used smokeless tobacco. He reports current alcohol  use of about 2.0 standard drinks of alcohol  per week. He reports current drug use. Drug: Marijuana.  Allergies: No Known Allergies  Medications: I have reviewed the patient's current medications. Scheduled:  sodium chloride    Intravenous Once   docusate  100 mg Per Tube BID   famotidine   20 mg Per Tube BID   fentaNYL  (SUBLIMAZE ) injection  50 mcg Intravenous Once   folic acid   1 mg Per Tube Daily   hydrocortisone  sod succinate (SOLU-CORTEF ) inj  100 mg  Intravenous Q12H   insulin  aspart  0-9 Units Subcutaneous Q4H   multivitamin with minerals  1 tablet Per Tube Daily   polyethylene glycol  17 g Per Tube Daily   thiamine   100 mg Oral Q24H   vecuronium        Continuous:  sodium chloride      calcium  gluconate     [START ON 02/15/2024] cefTRIAXone  (ROCEPHIN )  IV     doxycycline  (VIBRAMYCIN ) IV     epinephrine  10 mcg/min (01/31/24 1722)   fentaNYL  infusion INTRAVENOUS 25 mcg/hr (01/31/24 1743)   norepinephrine  (LEVOPHED ) Adult infusion 10 mcg/min (01/31/24 1729)   potassium chloride      prismasol  BGK 4/2.5     propofol  (DIPRIVAN ) infusion Stopped (01/31/24 1656)   sodium bicarbonate  150 mEq in sterile water  1,150 mL infusion     sodium bicarbonate  150 mEq in sterile water  1,150 mL infusion     sodium bicarbonate  150 mEq in sterile water  1,150 mL infusion     thiamine  (VITAMIN B1) injection Stopped (01/31/24 1734)   vancomycin         Latest Ref Rng & Units 01/31/2024    5:26 PM 01/31/2024    4:48 PM 07/18/2021   12:00 PM  BMP  Glucose 70 - 99 mg/dL  88  869   BUN 6 - 20 mg/dL  62  11   Creatinine 9.38 - 1.24 mg/dL  4.19  9.00   Sodium 864 - 145 mmol/L 134  137  135   Potassium 3.5 - 5.1 mmol/L 3.4  3.0  3.0   Chloride 98 - 111 mmol/L  103  99   CO2 22 - 32 mmol/L   26   Calcium  8.9 - 10.3 mg/dL   9.3       Latest Ref Rng & Units 01/31/2024    5:53 PM 01/31/2024    5:26 PM 01/31/2024    4:48 PM  CBC  WBC 4.0 - 10.5 K/uL 15.8     Hemoglobin 13.0 - 17.0 g/dL 86.5  84.9  83.9   Hematocrit 39.0 - 52.0 % 40.3  44.0  47.0   Platelets 150 - 400 K/uL 11       CT Angio Chest Pulmonary Embolism (PE) W or WO Contrast Result Date: 01/31/2024 CLINICAL DATA:  Cardiac arrest. CPR. Pulmonary embolism (PE) suspected, high prob EXAM: CT ANGIOGRAPHY CHEST WITH CONTRAST TECHNIQUE: Multidetector CT imaging of the chest was performed using the standard protocol during bolus administration of intravenous contrast. Multiplanar CT image reconstructions and  MIPs were obtained to evaluate the vascular anatomy. RADIATION DOSE REDUCTION: This exam was performed according to the departmental dose-optimization program which includes automated exposure control, adjustment of the mA and/or kV according to patient size and/or use of iterative reconstruction technique. CONTRAST:  65mL OMNIPAQUE  IOHEXOL  350 MG/ML SOLN COMPARISON:  Chest radiograph from earlier today. FINDINGS: Cardiovascular: The study is high quality for the evaluation of pulmonary embolism. There are no filling defects in the central, lobar, segmental or subsegmental pulmonary artery branches to suggest acute pulmonary embolism. Atherosclerotic nonaneurysmal thoracic aorta. Normal caliber pulmonary arteries. Normal heart size. No significant pericardial fluid/thickening. Left anterior descending and left circumflex coronary atherosclerosis. Mediastinum/Nodes: No significant thyroid nodules. Enteric tube terminates in the gastric fundus. Small fluid level in the lower thoracic esophagus, which is otherwise normal. No pathologically enlarged axillary, mediastinal or hilar lymph nodes. Small amount of pneumomediastinum in the anterior mediastinum surrounding aortic arch and AP window region. No mediastinal fluid collections. Lungs/Pleura: No pneumothorax. No pleural effusion. Endotracheal tube tip is 6.8 cm above the carina. Mild-to-moderate streaky atelectasis in the posterior lower lobes. No acute consolidative airspace disease, lung masses or significant pulmonary nodules. Upper abdomen: No acute abnormality. Musculoskeletal:  No aggressive appearing focal osseous lesions. Review of the MIP images confirms the above findings. IMPRESSION: 1. No pulmonary embolism. 2. Mild-to-moderate dependent lower lobe atelectasis. 3. Small amount of pneumomediastinum in the anterior mediastinum, suspected to be secondary to barotrauma from CPR. No mediastinal fluid collections. No esophageal pneumatosis. 4. Well-positioned  endotracheal and enteric tubes. 5. Two-vessel coronary atherosclerosis. 6.  Aortic Atherosclerosis (ICD10-I70.0). Electronically Signed   By: Selinda DELENA Blue M.D.   On: 01/31/2024 18:40   CT HEAD WO CONTRAST Result Date: 01/31/2024 CLINICAL DATA:  Found unresponsive, cardiac arrest EXAM: CT HEAD WITHOUT CONTRAST TECHNIQUE: Contiguous axial images were obtained from the base of the skull through the vertex without intravenous contrast. RADIATION DOSE REDUCTION: This exam was performed according to the departmental dose-optimization program which includes automated exposure control, adjustment of the mA and/or kV according to patient size and/or use of iterative reconstruction technique. COMPARISON:  None Available. FINDINGS: Brain: No evidence of acute infarction, hemorrhage, mass, mass effect, or midline shift. No hydrocephalus or extra-axial fluid collection. Gray-white differentiation is preserved. Vascular: No hyperdense vessel. Skull: Negative for fracture or focal lesion. Sinuses/Orbits: No acute finding. Other: The mastoid air cells are well aerated. Fluid in the nasopharynx, related to intubation. IMPRESSION: No acute intracranial process. These findings were discussed by telephone on 01/31/2024 at 6:14 pm with provider AGANWANLA. Electronically Signed   By: Donald  Vasan M.D.   On: 01/31/2024 18:15   DG Chest Portable 1 View Result Date: 01/31/2024 CLINICAL DATA:  Intubated. EXAM: PORTABLE CHEST 1 VIEW COMPARISON:  Earlier today. FINDINGS: Endotracheal tube in satisfactory position. Orogastric tube extending into the stomach. Borderline enlarged cardiac silhouette. Small amount of patchy density in the right mid lung zone the remainder of the lungs are clear. Unremarkable bones. IMPRESSION: 1. Satisfactory position of the endotracheal and orogastric tubes. 2. Small amount of patchy density in the right mid lung zone, which could represent small amount of pneumonia or a subsolid lung nodule. Followup PA and  lateral chest X-ray is recommended in 3-4 weeks following trial of antibiotic therapy to ensure resolution and exclude underlying malignancy. Electronically Signed   By: Elspeth Bathe M.D.   On: 01/31/2024 17:50   DG Abd Portable 1V Result Date: 01/31/2024 CLINICAL DATA:  Orogastric tube placement. EXAM: PORTABLE ABDOMEN - 1 VIEW COMPARISON:  None Available. FINDINGS: Orogastric tube tip and side hole in the proximal stomach. Partially included mildly prominent gas-filled bowel loops. Normal caliber stomach mild lumbar and lower thoracic spine degenerative changes. IMPRESSION: Orogastric tube tip and side hole in the proximal stomach. Electronically Signed   By: Elspeth Bathe M.D.   On: 01/31/2024 17:46   DG Chest Port 1 View Result Date: 01/31/2024 CLINICAL DATA:  Endotracheal tube EXAM: PORTABLE CHEST 1 VIEW COMPARISON:  Chest x-ray 02/13/2013 FINDINGS: Endotracheal tube tip is high measuring 10 cm above the carina. Enteric tube extends below the diaphragm, but distal tip can not be confirmed. Lines and tubes overlie the chest. The cardiomediastinal silhouette is within normal limits. There is a healing posterior right sixth rib fracture. Lungs are costophrenic angles are clear. No pneumothorax. IMPRESSION: 1. Endotracheal tube tip is high measuring 10 cm above the carina. Recommend advancing tube 5 cm. 2. No acute cardiopulmonary process. Electronically Signed   By: Greig Pique M.D.   On: 01/31/2024 17:44    Review of Systems  Unable to perform ROS: Intubated   Blood pressure (!) 114/101, pulse (!) 55, resp. rate (!) 26, height 6' 6 (1.981 m), weight 84.1 kg, SpO2 (!) 66%. Physical Exam Vitals reviewed.  Constitutional:      Appearance: He is normal weight. He is ill-appearing and toxic-appearing.     Comments: Intubated, unresponsive  HENT:     Head: Normocephalic and atraumatic.     Right Ear: External ear normal.     Left Ear: External ear normal.     Mouth/Throat:     Comments:  Intubated Cardiovascular:     Rate and Rhythm: Regular rhythm. Tachycardia present.     Pulses: Normal pulses.     Heart sounds: Normal heart sounds.  Pulmonary:     Effort: Pulmonary effort is normal.     Breath sounds: Normal breath sounds. No wheezing.     Comments: Ventilator breath sounds, no rales Abdominal:     General: Abdomen is flat. Bowel sounds are normal.     Palpations: Abdomen is soft.  Musculoskeletal:     Cervical back: Neck supple.     Right lower leg: No edema.     Left lower leg: No edema.  Skin:    General: Skin is warm and dry.     Coloration: Skin is not pale.     Assessment/Plan: 1.  Acute kidney injury: Appears to be from a combination of events including preceding prerenal azotemia from his viral prodrome/illness and now likely superimposed ATN status post  cardiac arrest.  He required intravenous contrast for CT angiogram to rule out PE in the emergency room that may also contribute to his renal injury.  Given his hemodynamic instability and profound metabolic acidosis, will begin CRRT.  High risk for mortality within the next 24 hours if unable to correct acidosis.  Will check a urinalysis and consider additional workup based on clinical trajectory over the next 24 hours. 2.  Mixed septic/cardiogenic shock: Status post PEA cardiac arrest with possible differential including septic shock.  On broad-spectrum antimicrobial coverage to include meningococcal infection.  With severe lactic acidosis status post cardiac arrest and with acute kidney injury. 3.  Acute hypoxic respiratory failure: Intubated and on ventilator support per critical care service.  Status postcardiac arrest. 4.  Severe thrombocytopenia: With evidence of bleeding diathesis; recent hematochezia which may indeed be in part from ischemic bowel injury status postcardiac arrest.  Jeremiah Curry Blanch 01/31/2024, 7:31 PM

## 2024-01-31 NOTE — ED Provider Notes (Signed)
 [facility] AT [facility] Provider Note  MDM   HPI/ROS:  Je[facility]iah [facility] is a 48 y.o. male with pertinent past medical history of EtOH abuse who presents following cardiac arrest. Patient's mom reports several days of viral illness including URI symptoms and vomiting, diarrhea, chills and subjective fevers.  Today she reports he had several what appeared to be presyncopal events when he was walking to and from the restroom and had difficulty responding and ambulating back.  During the last episode she came to check on him after he did not return from the bathroom and found him minimally responsive with some seizure-like activity as well as agonal respirations.  She leaned him against a wall and called EMS who report patient had GCS of 3 on arrival that gradually improved prior to bring him out to the ambulance for transport.  Shortly prior to transporting patient experienced an additional generalized tonic-clonic seizure event and subsequently rested.  They report patient was in PEA arrest the entire time and performing CPR for roughly 15 minutes with 3 doses of epi given prior to obtaining ROSC.  He was intubated prior to transport and started on an epi drip.   Physical exam is notable for: - Pupils 3 mm minimally reactive to light -GCS 3T -Diminished breath sounds bilaterally -ET tube 25 at the lips  On my initial evaluation, patient is:  -Blood pressure within normal limits off of epinephrine  drip, airway intact bilaterally diminished breath sounds and occasional agonal respirations over the vent. -Additional history obtained from EMS -Significant difficulty obtaining reliable pulse oximetry, was briefly 92% though without consistently reliable waveform.  Attempted to exchange for ear probe and exchange entire pulse oximetry cord without significant improvement.  Reconfirmed bilateral breath sounds though somewhat diminished bilaterally.  Patient connected to  end-tidal and noted to have good waveform with end-tidal CO2 ranging 20-30  Initial differential diagnosis includes but is not limited to myocarditis, hypoxic arrest due to aspiration event during withdrawal seizure, ingestion, infection.  EKG obtained on arrival with signs of global ischemia.  With diffuse depressions in the inferior first and lateral precordial leads but no evidence of STEMI.  2 large-bore IVs placed on arrival.  Shortly after arrival patient noted to have progressively worsening hypotension with cool extremities, concerning for cardiogenic shock.  Was restarted on epinephrine  drip however with continued hypotension, ultimately without reliable pulse and thus initiated code at 1656. Patient received one round of chest compressions with additional dose of epinephrine  with ROSC noted at time of next pulse check. Additionally given Keppra  load, thiamine /folate, empiric broad spectrum antibiotics.   CXR obtained and noted ETT placement above clavicles so was advanced 2 cm, on repeat still only at level of clavicles but deferred advancing further given now at 28 at the lips and noted equal breath sounds. Initial labs with lactic acid 14.4, istat chem 8 with hypokalemia at 3.0, creatinine 5.80 and BUN 62, hypocalcemia of 0.99. Formal BMP pending. Ultimately arterial line placed for closer hemodynamic monitoring and ABG was obtained given persistent difficulty obtaining reliable pulse ox reading, noted good pao2. Suspect renal failure likely in the setting of severe GI losses and dehydration due to recent illness. Additionally consider septic shock possibly contributing to clinical picture as well as possible alcohol  withdrawal given patient was previously a daily drinker but has not had any EtOH since onset of viral symptoms.    ICU team quickly evaluated patient and will continue his evaluation and management in the  ICU. Remainder of labs at CT head were pending at time of handoff. Please see  their note for treatment plan details.    Disposition:  I discussed the case with Dr. Arlinda with the ICU team who graciously agreed to admit the patient to their service for continued care.   Clinical Impression:  1. Cardiac arrest [facility])     Rx / DC Orders ED Discharge Orders     None       The plan for this patient was discussed with Dr. Zavitz, who voiced agreement and who oversaw evaluation and treatment of this patient.   Clinical Complexity A medically appropriate history, review of systems, and physical exam was performed.  My independent interpretations of EKG, labs, and radiology are documented in the ED course above.   If decision rules were used in this patient's evaluation, they are listed below.   Patient's presentation is most consistent with acute presentation with potential threat to life or bodily function.  Medical Decision Making Amount and/or Complexity of Data Reviewed Labs: ordered. Radiology: ordered.  Risk OTC drugs. Prescription drug management. Decision regarding hospitalization.    HPI/ROS      See MDM section for pertinent HPI and ROS. A complete ROS was performed with pertinent positives/negatives noted above.   Past Medical History:  Diagnosis Date   Asthma     History reviewed. No pertinent surgical history.    Physical Exam   Vitals:   01/31/24 1717 01/31/24 1718 01/31/24 1720 01/31/24 1722  BP:      Pulse:      Resp: (!) 26 (!) 27 (!) 28 (!) 27  SpO2:        Physical Exam Gen: Intubated. Unresponsive HENT: Conjunctiva clear, pupils 3mm minimally responsive.  CV: RRR. No M/R/G Pulm: Ventilated breath sounds, poor air movement. No audible crackles.  GI: Abdomen soft, non-distended.  MSK/Skin: No lower extremity edema. Extremities cool to touch. Strong femoral pulses, 1+ radial pulses. Neuro: unresponsive. GCS 3T.     Procedures   If procedures were preformed on this patient, they are listed below:   Procedures   Jeremiah Rimes, MD Emergency Medicine PGY-2   Please note that this documentation was produced with the assistance of voice-to-text technology and may contain errors.    Curry Laverda, MD 2024-02-17 [id number]    Tonia Chew, MD 02/02/24 2214

## 2024-01-31 NOTE — ED Triage Notes (Signed)
 Pt was found unresponsive on toilet, agonal breathing, mom started CPR. Fire bagged pt, pts GCS went from 3 to 12. Pt had witnessed seizure in truck and went agonal, PEA rate in 30's. No hx of seizure, hx of ETOH. Pt received 10-15 CPR, 3 epis. Pt arrives intubated 25 at teeth.

## 2024-01-31 NOTE — Progress Notes (Signed)
 Pharmacy Antibiotic Note  Je[facility] is a 48 y.o. male for which pharmacy has been consulted for ceftriaxone  and vancomycin  dosing for pneumonia, bacteremia, and meningitis.  He is also on doxycycline .  Patient arrives as a cardiac arrest. Positive for cough, SOB, diarrhea (reported family w/ flu).  SCr 5.8 WBC pending; LA 14.4; HR 55; RR 26  Addendum: to start CRRT  Plan: Ceftriaxone  2g IV Q12H Vancomycin  1750 mg IV x 1, then 1gm IV 24H while on CRRT Monitor CRRT tolerance/interruption, clinical progress, vanc levels as indicated   Height: 6' 6 (198.1 cm) Weight: 84.1 kg (185 lb 6.5 oz) IBW/kg (Calculated) : 91.4  No data recorded.  Recent Labs  Lab 01/31/24 1648 01/31/24 1753  WBC  --  15.8*  CREATININE 5.80*  --   LATICACIDVEN 14.4*  --     Estimated Creatinine Clearance: 19.1 mL/min (A) (by C-G formula based on SCr of 5.8 mg/dL (H)).    No Known Allergies  Cefepime  x1 2/7 Rocephin  14-Feb-2024 >> Doxy 2/7 >> Vanc 2/7 >>   2/7 BCx -  2/7  RVP -  Britnie Colville D. Lendell, PharmD, BCPS, BCCCP 01/31/2024, 7:43 PM

## 2024-01-31 NOTE — Progress Notes (Signed)
 Family updated.

## 2024-01-31 NOTE — Progress Notes (Signed)
 Came bedside for stat echo, but patient is still waiting to go to CT.   Please contact on call cardiology for echos after 5pm.

## 2024-02-01 ENCOUNTER — Inpatient Hospital Stay (HOSPITAL_COMMUNITY): Payer: MEDICAID

## 2024-02-01 DIAGNOSIS — Z66 Do not resuscitate: Secondary | ICD-10-CM

## 2024-02-01 DIAGNOSIS — Z515 Encounter for palliative care: Secondary | ICD-10-CM

## 2024-02-01 DIAGNOSIS — E872 Acidosis, unspecified: Secondary | ICD-10-CM

## 2024-02-01 DIAGNOSIS — G9340 Encephalopathy, unspecified: Secondary | ICD-10-CM

## 2024-02-01 DIAGNOSIS — N179 Acute kidney failure, unspecified: Secondary | ICD-10-CM

## 2024-02-01 DIAGNOSIS — R579 Shock, unspecified: Secondary | ICD-10-CM

## 2024-02-01 LAB — POCT I-STAT 7, (LYTES, BLD GAS, ICA,H+H)
Acid-base deficit: 11 mmol/L — ABNORMAL HIGH (ref 0.0–2.0)
Acid-base deficit: 13 mmol/L — ABNORMAL HIGH (ref 0.0–2.0)
Acid-base deficit: 8 mmol/L — ABNORMAL HIGH (ref 0.0–2.0)
Bicarbonate: 15.1 mmol/L — ABNORMAL LOW (ref 20.0–28.0)
Bicarbonate: 13.3 mmol/L — ABNORMAL LOW (ref 20.0–28.0)
Bicarbonate: 19.8 mmol/L — ABNORMAL LOW (ref 20.0–28.0)
Calcium, Ion: 0.8 mmol/L — CL (ref 1.15–1.40)
Calcium, Ion: 0.81 mmol/L — CL (ref 1.15–1.40)
Calcium, Ion: 1.1 mmol/L — ABNORMAL LOW (ref 1.15–1.40)
HCT: 39 % (ref 39.0–52.0)
HCT: 35 % — ABNORMAL LOW (ref 39.0–52.0)
HCT: 43 % (ref 39.0–52.0)
Hemoglobin: 13.3 g/dL (ref 13.0–17.0)
Hemoglobin: 11.9 g/dL — ABNORMAL LOW (ref 13.0–17.0)
Hemoglobin: 14.6 g/dL (ref 13.0–17.0)
O2 Saturation: 100 %
O2 Saturation: 100 %
O2 Saturation: 100 %
Patient temperature: 37.2
Patient temperature: 36.9
Patient temperature: 37.2
Potassium: 3.8 mmol/L (ref 3.5–5.1)
Potassium: 4.1 mmol/L (ref 3.5–5.1)
Potassium: 5.3 mmol/L — ABNORMAL HIGH (ref 3.5–5.1)
Sodium: 135 mmol/L (ref 135–145)
Sodium: 133 mmol/L — ABNORMAL LOW (ref 135–145)
Sodium: 141 mmol/L (ref 135–145)
TCO2: 16 mmol/L — ABNORMAL LOW (ref 22–32)
TCO2: 14 mmol/L — ABNORMAL LOW (ref 22–32)
TCO2: 21 mmol/L — ABNORMAL LOW (ref 22–32)
pCO2 arterial: 33.8 mm[Hg] (ref 32–48)
pCO2 arterial: 32.9 mm[Hg] (ref 32–48)
pCO2 arterial: 49.2 mm[Hg] — ABNORMAL HIGH (ref 32–48)
pH, Arterial: 7.26 — ABNORMAL LOW (ref 7.35–7.45)
pH, Arterial: 7.211 — ABNORMAL LOW (ref 7.35–7.45)
pH, Arterial: 7.215 — ABNORMAL LOW (ref 7.35–7.45)
pO2, Arterial: 297 mm[Hg] — ABNORMAL HIGH (ref 83–108)
pO2, Arterial: 265 mm[Hg] — ABNORMAL HIGH (ref 83–108)
pO2, Arterial: 399 mm[Hg] — ABNORMAL HIGH (ref 83–108)

## 2024-02-01 LAB — BASIC METABOLIC PANEL
Anion gap: 31 — ABNORMAL HIGH (ref 5–15)
BUN: 44 mg/dL — ABNORMAL HIGH (ref 6–20)
CO2: 11 mmol/L — ABNORMAL LOW (ref 22–32)
Calcium: 6.4 mg/dL — CL (ref 8.9–10.3)
Chloride: 99 mmol/L (ref 98–111)
Creatinine, Ser: 5.12 mg/dL — ABNORMAL HIGH (ref 0.61–1.24)
GFR, Estimated: 13 mL/min — ABNORMAL LOW (ref >60)
Glucose, Bld: 142 mg/dL — ABNORMAL HIGH (ref 70–99)
Potassium: 3 mmol/L — ABNORMAL LOW (ref 3.5–5.1)
Sodium: 141 mmol/L (ref 135–145)

## 2024-02-01 LAB — COMPREHENSIVE METABOLIC PANEL
ALT: 1210 U/L — ABNORMAL HIGH (ref 0–44)
AST: 6824 U/L — ABNORMAL HIGH (ref 15–41)
Albumin: 2 g/dL — ABNORMAL LOW (ref 3.5–5.0)
Alkaline Phosphatase: 107 U/L (ref 38–126)
Anion gap: 27 — ABNORMAL HIGH (ref 5–15)
BUN: 35 mg/dL — ABNORMAL HIGH (ref 6–20)
CO2: 14 mmol/L — ABNORMAL LOW (ref 22–32)
Calcium: 6.6 mg/dL — ABNORMAL LOW (ref 8.9–10.3)
Chloride: 96 mmol/L — ABNORMAL LOW (ref 98–111)
Creatinine, Ser: 4.06 mg/dL — ABNORMAL HIGH (ref 0.61–1.24)
GFR, Estimated: 18 mL/min — ABNORMAL LOW (ref >60)
Glucose, Bld: 143 mg/dL — ABNORMAL HIGH (ref 70–99)
Potassium: 3.7 mmol/L (ref 3.5–5.1)
Sodium: 137 mmol/L (ref 135–145)
Total Bilirubin: 2.5 mg/dL — ABNORMAL HIGH (ref 0.0–1.2)
Total Protein: 6 g/dL — ABNORMAL LOW (ref 6.5–8.1)

## 2024-02-01 LAB — TYPE AND SCREEN
ABO/RH(D): A POS
Antibody Screen: NEGATIVE
Unit division: 0

## 2024-02-01 LAB — CBC
HCT: 36.5 % — ABNORMAL LOW (ref 39.0–52.0)
Hemoglobin: 13 g/dL (ref 13.0–17.0)
MCH: 35.7 pg — ABNORMAL HIGH (ref 26.0–34.0)
MCHC: 35.6 g/dL (ref 30.0–36.0)
MCV: 100.3 fL — ABNORMAL HIGH (ref 80.0–100.0)
Platelets: 28 10*3/uL — CL (ref 150–400)
RBC: 3.64 MIL/uL — ABNORMAL LOW (ref 4.22–5.81)
RDW: 16.7 % — ABNORMAL HIGH (ref 11.5–15.5)
WBC: 8.1 10*3/uL (ref 4.0–10.5)
nRBC: 4.7 % — ABNORMAL HIGH (ref 0.0–0.2)

## 2024-02-01 LAB — PHOSPHORUS: Phosphorus: 7.4 mg/dL — ABNORMAL HIGH (ref 2.5–4.6)

## 2024-02-01 LAB — PREPARE PLATELET PHERESIS: Unit division: 0

## 2024-02-01 LAB — GLUCOSE, CAPILLARY: Glucose-Capillary: 139 mg/dL — ABNORMAL HIGH (ref 70–99)

## 2024-02-01 LAB — BPAM PLATELET PHERESIS
Blood Product Expiration Date: 202502092359
ISSUE DATE / TIME: 202502071928
Unit Type and Rh: 5100

## 2024-02-01 LAB — BPAM RBC
Blood Product Expiration Date: 202503082359
ISSUE DATE / TIME: 202502072014
Unit Type and Rh: 6200

## 2024-02-01 LAB — TRIGLYCERIDES: Triglycerides: 277 mg/dL — ABNORMAL HIGH (ref <150)

## 2024-02-01 LAB — MAGNESIUM: Magnesium: 1.8 mg/dL (ref 1.7–2.4)

## 2024-02-01 LAB — MRSA NEXT GEN BY PCR, NASAL: MRSA by PCR Next Gen: NOT DETECTED

## 2024-02-01 LAB — LACTIC ACID, PLASMA
Lactic Acid, Venous: >9 mmol/L (ref 0.5–1.9)
Lactic Acid, Venous: >9 mmol/L (ref 0.5–1.9)

## 2024-02-01 MED ORDER — EPINEPHRINE 0.1 MG/10ML (10 MCG/ML) SYRINGE FOR IV PUSH (FOR BLOOD PRESSURE SUPPORT)
PREFILLED_SYRINGE | INTRAVENOUS | Status: AC | PRN
  Administered 2024-02-01: 40 ug via INTRAVENOUS

## 2024-02-01 MED ORDER — CALCIUM CHLORIDE 10 % IV SOLN
INTRAVENOUS | Status: AC
  Filled 2024-02-01: qty 20

## 2024-02-01 MED ORDER — SODIUM BICARBONATE 8.4 % IV SOLN
INTRAVENOUS | Status: AC
  Filled 2024-02-01: qty 100

## 2024-02-01 MED ORDER — ORAL CARE MOUTH RINSE
15.0000 mL | OROMUCOSAL | Status: DC
  Administered 2024-02-01 (×2): 15 mL via OROMUCOSAL

## 2024-02-01 MED ORDER — MIDAZOLAM HCL 2 MG/2ML IJ SOLN
2.0000 mg | INTRAMUSCULAR | Status: DC | PRN
  Administered 2024-02-01: 2 mg via INTRAVENOUS
  Filled 2024-02-01: qty 2

## 2024-02-01 MED ORDER — ALBUTEROL SULFATE (2.5 MG/3ML) 0.083% IN NEBU
10.0000 mg/h | INHALATION_SOLUTION | RESPIRATORY_TRACT | Status: AC
  Administered 2024-02-01: 10 mg/h via RESPIRATORY_TRACT

## 2024-02-01 MED ORDER — NOREPINEPHRINE 16 MG/250ML-% IV SOLN
0.0000 ug/min | INTRAVENOUS | Status: DC
  Administered 2024-02-01: 13 ug/min via INTRAVENOUS
  Filled 2024-02-01: qty 250

## 2024-02-01 MED ORDER — ACETAMINOPHEN 650 MG RE SUPP: 650.0000 mg | Freq: Four times a day (QID) | RECTAL | Status: DC | PRN

## 2024-02-01 MED ORDER — CHLORHEXIDINE GLUCONATE CLOTH 2 % EX PADS: 6.0000 | MEDICATED_PAD | Freq: Every day | CUTANEOUS | Status: DC

## 2024-02-01 MED ORDER — FENTANYL BOLUS VIA INFUSION
100.0000 ug | INTRAVENOUS | Status: DC | PRN
  Administered 2024-02-01: 100 ug via INTRAVENOUS

## 2024-02-01 MED ORDER — NOREPINEPHRINE BITARTRATE 1 MG/ML IV SOLN
INTRAVENOUS | Status: AC | PRN
  Administered 2024-02-01: 80 ug/kg/min via INTRAVENOUS

## 2024-02-01 MED ORDER — SODIUM CHLORIDE 0.9% FLUSH
10.0000 mL | Freq: Two times a day (BID) | INTRAVENOUS | Status: DC
  Administered 2024-02-01: 40 mL

## 2024-02-01 MED ORDER — SODIUM BICARBONATE 8.4 % IV SOLN: 100.0000 meq | Freq: Once | INTRAVENOUS | Status: DC

## 2024-02-01 MED ORDER — STERILE WATER FOR INJECTION IJ SOLN
INTRAMUSCULAR | Status: AC
  Administered 2024-02-01: 10 mL
  Filled 2024-02-01: qty 10

## 2024-02-01 MED ORDER — SODIUM CHLORIDE 0.9% FLUSH: 10.0000 mL | INTRAVENOUS | Status: DC | PRN

## 2024-02-01 MED ORDER — CALCIUM GLUCONATE-NACL 2-0.675 GM/100ML-% IV SOLN
2.0000 g | Freq: Once | INTRAVENOUS | Status: AC
  Administered 2024-02-01: 2000 mg via INTRAVENOUS
  Filled 2024-02-01 (×2): qty 100

## 2024-02-01 MED ORDER — SODIUM BICARBONATE 8.4 % IV SOLN
INTRAVENOUS | Status: DC
  Filled 2024-02-01: qty 1000

## 2024-02-01 MED ORDER — EPINEPHRINE 1 MG/10ML IJ SOSY
PREFILLED_SYRINGE | INTRAMUSCULAR | Status: AC
  Filled 2024-02-01: qty 10

## 2024-02-01 MED ORDER — CALCIUM GLUCONATE-NACL 2-0.675 GM/100ML-% IV SOLN
2.0000 g | Freq: Once | INTRAVENOUS | Status: AC
  Administered 2024-02-01: 2000 mg via INTRAVENOUS
  Filled 2024-02-01: qty 100

## 2024-02-01 MED ORDER — SODIUM BICARBONATE 8.4 % IV SOLN
50.0000 meq | Freq: Once | INTRAVENOUS | Status: AC
  Administered 2024-02-01: 50 meq via INTRAVENOUS
  Filled 2024-02-01: qty 50

## 2024-02-01 MED ORDER — EPINEPHRINE 1 MG/10ML IJ SOSY
PREFILLED_SYRINGE | INTRAMUSCULAR | Status: AC | PRN
  Administered 2024-02-01 (×3): 1 mg via INTRAVENOUS

## 2024-02-01 MED ORDER — ACETAMINOPHEN 325 MG PO TABS: 650.0000 mg | ORAL_TABLET | Freq: Four times a day (QID) | ORAL | Status: DC | PRN

## 2024-02-01 MED ORDER — ALBUTEROL SULFATE (2.5 MG/3ML) 0.083% IN NEBU
2.5000 mg | INHALATION_SOLUTION | RESPIRATORY_TRACT | Status: DC
  Administered 2024-02-01: 2.5 mg via RESPIRATORY_TRACT
  Filled 2024-02-01: qty 3

## 2024-02-01 MED ORDER — GLYCOPYRROLATE 0.2 MG/ML IJ SOLN: 0.2000 mg | INTRAMUSCULAR | Status: DC | PRN

## 2024-02-01 MED ORDER — MAGNESIUM SULFATE 4 GM/100ML IV SOLN: 4.0000 g | Freq: Once | INTRAVENOUS | Status: DC

## 2024-02-01 MED ORDER — EPINEPHRINE 1 MG/10ML IJ SOSY
PREFILLED_SYRINGE | INTRAMUSCULAR | Status: AC | PRN
  Administered 2024-02-01: 1 mg via INTRAVENOUS

## 2024-02-01 MED ORDER — GLYCOPYRROLATE 1 MG PO TABS: 1.0000 mg | ORAL_TABLET | ORAL | Status: DC | PRN

## 2024-02-01 MED ORDER — MIDAZOLAM HCL 2 MG/2ML IJ SOLN
2.0000 mg | Freq: Once | INTRAMUSCULAR | Status: AC
  Administered 2024-02-01: 2 mg via INTRAVENOUS
  Filled 2024-02-01: qty 2

## 2024-02-01 MED ORDER — STERILE WATER FOR INJECTION IV SOLN
INTRAVENOUS | Status: DC
  Filled 2024-02-01: qty 1000

## 2024-02-01 MED ORDER — ORAL CARE MOUTH RINSE: 15.0000 mL | OROMUCOSAL | Status: DC | PRN

## 2024-02-01 MED ORDER — SODIUM BICARBONATE 8.4 % IV SOLN
100.0000 meq | Freq: Once | INTRAVENOUS | Status: AC
  Administered 2024-02-01: 100 meq via INTRAVENOUS

## 2024-02-01 MED ORDER — CALCIUM CHLORIDE 10 % IV SOLN
INTRAVENOUS | Status: AC | PRN
  Administered 2024-02-01 (×2): 1 g via INTRAVENOUS

## 2024-02-01 MED ORDER — EPINEPHRINE PF 1 MG/ML IJ SOLN
INTRAMUSCULAR | Status: AC
  Filled 2024-02-01: qty 1

## 2024-02-01 MED ORDER — SODIUM CHLORIDE 0.9 % IV SOLN
INTRAVENOUS | Status: AC | PRN
  Administered 2024-02-01: 999 mL/h via INTRAVENOUS

## 2024-02-01 MED ORDER — POLYVINYL ALCOHOL 1.4 % OP SOLN: 1.0000 [drp] | Freq: Four times a day (QID) | OPHTHALMIC | Status: DC | PRN

## 2024-02-01 MED ORDER — SODIUM BICARBONATE 8.4 % IV SOLN
INTRAVENOUS | Status: AC | PRN
  Administered 2024-02-01 (×4): 50 meq via INTRAVENOUS

## 2024-02-01 MED ORDER — FENTANYL 2500MCG IN NS 250ML (10MCG/ML) PREMIX INFUSION: 0.0000 ug/h | INTRAVENOUS | Status: DC

## 2024-02-03 LAB — BLOOD CULTURE ID PANEL (REFLEXED) - BCID2

## 2024-02-05 LAB — CULTURE, BLOOD (ROUTINE X 2)
Culture  Setup Time: NO GROWTH
Culture: NO GROWTH
Special Requests: ADEQUATE

## 2024-02-22 NOTE — Progress Notes (Signed)
 Mother, wife, and another family member updated at bedside throughout code. ROSC achieved and we have discussed that my fear is this has a high chance of occurring again due to failure to response to bicarb gtt, CRRT with bicarb, and aggressive ventilation pre-arrest. He likely has refractory acidosis that will have been worsened with another arrest. Attempting to reconnect CRRT and titrating pressors.   Jeremiah SHAUNNA Gaskins, DO 02-24-24 8:14 AM [facility]  For contact information, see Amion. If no response to pager, please call PCCM consult pager. After hours, 7PM- 7AM, please call Elink.

## 2024-02-22 NOTE — Procedures (Signed)
 Extubation Procedure Note  Patient Details:   Name: [facility] DOB: 1976/11/09 MRN: [id number]   Airway Documentation:    Vent end date: February 21, 2024 Vent end time: 1015   Evaluation  O2 sats: transiently fell during during procedure Complications: No apparent complications Patient tolerate procedure well. Bilateral Breath Sounds: Diminished   No  Pt had a compassionate extubation per order.   Shan LITTIE Collum 21-Feb-2024, 10:16 AM

## 2024-02-22 NOTE — Progress Notes (Signed)
 Code- PEA, responded with ACLS. Bicarb and calcium  pushed multiple times in addition to epi. All pulse checks were PEA.   Family updated at bedside throughout code and after. Code duration 20 min. See separate nursing documentation for timeline.   Post-code I remained at bedside managing hemodynamics and respiratory status. Required multiple vent titrations and doses of albuterol  for wheezing and respiratory acidosis. I personally titrated pressors with multiple verbal orders to nurses to titrate more aggressively than the set parameters based on hemodynamic changes. Nursing communication order placed in chart to reflect need for titration of vasopressors more aggressively than traditional parameters.    Family now at bedside, awaiting one additional family member to come to bedside before initiating comfort focused measures and terminally extubating.   Cc time: 85 min.  Leita SHAUNNA Gaskins, DO 02/27/24 9:15 AM [facility]  For contact information, see Amion. If no response to pager, please call PCCM consult pager. After hours, 7PM- 7AM, please call Elink.

## 2024-02-22 NOTE — Procedures (Signed)
 Central Venous Catheter Insertion Procedure Note  Je[facility]  [id number]  Jul 11, 1976  Date:01-Mar-2024  Time:7:29 AM   Provider Performing:Tomeeka Plaugher   Procedure: Insertion of Non-tunneled Central Venous Catheter(36556)with US  guidance (23062)    Indication(s) Hemodialysis  Consent Unable to obtain consent due to emergent nature of procedure.  Anesthesia Topical only with 1% lidocaine    Timeout Verified patient identification, verified procedure, site/side was marked, verified correct patient position, special equipment/implants available, medications/allergies/relevant history reviewed, required imaging and test results available.  Sterile Technique Maximal sterile technique including full sterile barrier drape, hand hygiene, sterile gown, sterile gloves, mask, hair covering, sterile ultrasound probe cover (if used).  Procedure Description Area of catheter insertion was cleaned with chlorhexidine  and draped in sterile fashion.   With real-time ultrasound guidance a HD catheter was placed into the right femoral vein.  Nonpulsatile blood flow and easy flushing noted in all ports.  The catheter was sutured in place and sterile dressing applied.    Complications/Tolerance None; patient tolerated the procedure well. Chest X-ray is ordered to verify placement for internal jugular or subclavian cannulation.  Chest x-ray is not ordered for femoral cannulation.  EBL Minimal  Specimen(s) None   Fredia Alderton, MD [facility] ICU Physician [facility]  Pager: [phone] Or Epic Secure Chat After hours: [phone].  01-Mar-2024, 7:29 AM

## 2024-02-22 NOTE — Progress Notes (Signed)
 Attempted stat echo, but patient is in code currently. Please call echo tech at [phone] when patient is available for echo.

## 2024-02-22 NOTE — Progress Notes (Signed)
 NAME:  BRAND SIEVER, MRN:  [id number], DOB:  February 02, 1976, LOS: 1 ADMISSION DATE:  01/31/2024, CONSULTATION DATE:  2/7 REFERRING MD:  gardiner, CHIEF COMPLAINT:  cardiac arrest   History of Present Illness:  Usual SOH up until few days PTA. + viral syndrome w/ cough, SOB, diarrhea (family w/ flu). Am on 2/7 mother noted presyncopal episode and worsening weakness w/ trouble leaving rest room.  EMS called. He was hypotensive. Had witnessed seizure like episode once in truck followed by PEA arrest X 15 minutes to ROSC. Placed on epi gtt. Attempted to wean to off, had another episode of PEA arrest req'd one round ACLS.  Initial scr 5.8, K 3, bicarb 15, lactate 14. Abg 7.00/43/356/bicarb 11 PCCM asked to admit  Pertinent  Medical History  etoh Substance abuse  Significant Hospital Events: Including procedures, antibiotic start and stop dates in addition to other pertinent events   2/7 admitted s/p cardiac arrest currently in cardiogenic shock.  Started on broad-spectrum antibiotics CT head and chest obtained pending at the time of admission 02/04/2024 cardiac arrest   Interim History / Subjective:  I was notified of worsening hypotension around 740, and limitations in titration parameters of pressors, not allowing up-titration as per joint commission regulations. Verbal orders were given to up-tirated, and as these orders were being entered the patient had a PEA arrest  Objective   Blood pressure 110/88, pulse (!) 106, temperature 99 F (37.2 C), resp. rate (!) 29, height 6' 6 (1.981 m), weight 86.1 kg, SpO2 100%.    Vent Mode: PRVC FiO2 (%):  [50 %-100 %] 50 % Set Rate:  [18 bmp-32 bmp] 32 bmp Vt Set:  [560 mL-700 mL] 640 mL PEEP:  [5 cmH20-10 cmH20] 5 cmH20 Plateau Pressure:  [14 cmH20-28 cmH20] 21 cmH20   Intake/Output Summary (Last 24 hours) at Feb 04, 2024 9092 Last data filed at 02/04/24 0800 Gross per 24 hour  Intake 7087.78 ml  Output 2326 ml  Net 4761.78 ml   Filed Weights   01/31/24  1900 February 04, 2024 0355  Weight: 84.1 kg 86.1 kg    Examination: General: critically ill middle aged M intubated  HENT: ETT secure  Lungs: Poor air movement  Cardiovascular:  tachycardic  Abdomen: soft ndnt  Extremities: no acute joint deformity  Neuro: unresponsive  GU: foley   Resolved Hospital Problem list     Assessment & Plan:   GOC DNR discussion -overall prognosis is very poor in this pt w refractory acidosis and worsening shock -DNR is recommended, with consideration of transitioning to comfort care -Dr. Gretta is discussing with family -- at this point is full code full scope of practice  Acute encephalopathy  Seizure vs Seizure like activity (reportedly in transit w EMS) - metabolic : profound acidosis, multisystem organ failure. With multiple arrests --15 min, 5 min and 20 min duration --c/f anoxic injury as well P -dc sedation  -following his arrest 2024/02/04, we need to first stabilize him. If able, will consider a cEEG w reported sz like activity w EMS, though there has not been add'l witnessed sz like activity inpt  -should his body sustain, would get MRI brain in 72hr   PEA arrest, OOH PEA arrest, in hospital -original etiology unclear. UDS + cocaine and THC. Has Flu.  -In-pt arrest likely acidosis mediated  P -aggressive supportive care as otherwise outlined  Shock -- distributive: septic shock + vasodilatory 2/2 profound metabolic acidosis  P -epi was incr to 40, NE to 80 during code.  Vaso continued throughout. Wean down pressors as able. Suspect our BP will cont to drop as meds wear off post arrest -cont stress dose steroids -restart CRRT and bicarb gtt -- if we cannot manage his acidosis I do not think that there is a feasible path for survival.  -he was started on broad abx on admission as well with concern for bacteria infection as well. Will continue these  -ECHO is pending   Acute respiratory failure with hypoxia Flu A infection without PNA ?Bronchospasm   -his CXR in ED was essentially clear  P -STAT CXR now  following his 2/8 arrest -- poor air movement  -cont albuterol  neb -4g mag -timing wise, there is not a great role for Tamilfu   AKI Severe AGMA  Lactic acidosis  Hyperphosphatemia Hypocalcemia  P -restart bicarb gtt. Numerous amps given pre and peri arrest, and verba was given to restart gtt peri arrest.  -restart CRRT -- hemodynamic tolerance of this would be paramount to any chance of survival  -will check a CMP and mag post arrest, forego LA as this will knowingly be high and have no impact on management -Ca has been replaced pre and peri arrest   Elevated LFTs -- ? Shock liver vs alt hepatic path, dili etc  Hyperbilirubinemia  Coagulopathy  P -trend LFTs, coags  -will check an APAP level -- with URI sx preceding admission, ? Incr APAP use -pending subsequent GOC discussions, if we are continuing aggressive GOC will probably just start empiric NAC. Ow risks.   Thrombocytopenia -DIC vs liver dysfunction. No schistos but that would not entirely preclude dx of Dic which is a clinical syndrome.  -rcvd 1 pack of plt 2/7 P -no chemical vte ppx w plt of 28  -repeat DIC panel 2/9    Best Practice (right click and Reselect all SmartList Selections daily)   Diet/type: NPO DVT prophylaxis SCD Pressure ulcer(s): N/A GI prophylaxis: H2B Lines: Central line, Dialysis Catheter, and Arterial Line Foley:  Yes, and it is still needed Code Status:  full code Last date of multidisciplinary goals of care discussion [pending]  Labs   CBC: Recent Labs  Lab 01/31/24 1753 01/31/24 1858 01/31/24 2223 01/31/24 2345 2024-02-18 0330 February 18, 2024 0418 2024/02/18 0819  WBC 15.8*  --  7.6  --  8.1  --   --   HGB 13.4   < > 13.0 13.3 13.0 13.3 11.9*  HCT 40.3   < > 37.4* 39.0 36.5* 39.0 35.0*  MCV 109.8*  --  103.3*  --  100.3*  --   --   PLT 11*  --  38*  38*  --  28*  --   --    < > = values in this interval not displayed.     Basic Metabolic Panel: Recent Labs  Lab 01/31/24 1648 01/31/24 1726 01/31/24 1857 01/31/24 1858 01/31/24 2223 01/31/24 2345 2024/02/18 0330 02/18/2024 0418 2024/02/18 0819  NA 137   < > 139   < > 141 137 137 135 141  K 3.0*   < > 2.8*   < > 3.0* 3.2* 3.7 3.8 4.1  CL 103  --  95*  --  99  --  96*  --   --   CO2  --   --  14*  --  11*  --  14*  --   --   GLUCOSE 88  --  74  --  142*  --  143*  --   --  BUN 62*  --  43*  --  44*  --  35*  --   --   CREATININE 5.80*  --  5.05*  --  5.12*  --  4.06*  --   --   CALCIUM   --   --  6.8*  --  6.4*  --  6.6*  --   --   MG  --   --  2.2  --   --   --  1.8  --   --   PHOS  --   --  >30.0*  --   --   --  7.4*  --   --    < > = values in this interval not displayed.   GFR: Estimated Creatinine Clearance: 28 mL/min (A) (by C-G formula based on SCr of 4.06 mg/dL (H)). Recent Labs  Lab 01/31/24 1753 01/31/24 1857 01/31/24 1908 01/31/24 2223 01/31/24 2244 2024/02/28 0330 28-Feb-2024 0335 28-Feb-2024 0729  PROCALCITON  --  6.55  --   --   --   --   --   --   WBC 15.8*  --   --  7.6  --  8.1  --   --   LATICACIDVEN  --   --  13.0*  --  13.6*  --  >9.0* >9.0*    Liver Function Tests: Recent Labs  Lab 01/31/24 1857 02-28-24 0330  AST 2,056* 6,824*  ALT 936* 1,210*  ALKPHOS 98 107  BILITOT 2.3* 2.5*  PROT 6.1* 6.0*  ALBUMIN 2.0* 2.0*   No results for input(s): LIPASE, AMYLASE in the last 168 hours. No results for input(s): AMMONIA in the last 168 hours.  ABG    Component Value Date/Time   PHART 7.211 (L) 2024-02-28 0819   PCO2ART 49.2 (H) 02/28/2024 0819   PO2ART 399 (H) 2024/02/28 0819   HCO3 19.8 (L) 02/28/24 0819   TCO2 21 (L) 02/28/2024 0819   ACIDBASEDEF 8.0 (H) 02/28/24 0819   O2SAT 100 February 28, 2024 0819     Coagulation Profile: Recent Labs  Lab 01/31/24 1857 01/31/24 2223  INR 1.7* 2.1*    Cardiac Enzymes: No results for input(s): CKTOTAL, CKMB, CKMBINDEX, TROPONINI in the last 168  hours.  HbA1C: Hgb A1c MFr Bld  Date/Time Value Ref Range Status  01/31/2024 05:38 PM 5.0 4.8 - 5.6 % Final    Comment:    (NOTE) Pre diabetes:          5.7%-6.4%  Diabetes:              >6.4%  Glycemic control for   <7.0% adults with diabetes     CBG: Recent Labs  Lab 01/31/24 1623 01/31/24 2019 01/31/24 2312 02-28-24 0344  GLUCAP 91 105* 155* 139*   CRITICAL CARE Performed by: Ronnald FORBES Gave  Total critical care time: 79 minutes   Critical care time was exclusive of separately billable procedures and treating other patients. Critical care was necessary to treat or prevent imminent or life-threatening deterioration.  Critical care was time spent personally by me on the following activities: development of treatment plan with patient and/or surrogate as well as nursing, discussions with consultants, evaluation of patient's response to treatment, examination of patient, obtaining history from patient or surrogate, ordering and performing treatments and interventions, ordering and review of laboratory studies, ordering and review of radiographic studies, pulse oximetry and re-evaluation of patient's condition.   Ronnald Gave MSN, AGACNP-BC [facility] Amion for pager  Feb 28, 2024, 9:07 AM

## 2024-02-22 NOTE — Progress Notes (Signed)
   02/15/24 0800  Spiritual Encounters  Type of Visit Initial  Care provided to: [facility] partners present during encounter Nurse  Referral source Nurse (RN/NT/LPN)  Reason for visit Code  OnCall Visit Yes   Chaplain was paged Code Blue. Chaplain was with pt's wife, mother, mother in-law and daughter. The pt's spouse is experiencing profound distress due to her husband's critical condition. Just two days ago, they came to the hospital for what seemed to be flu, and now she is faced with the possibility of losing him. She is currently confronted with the difficult decision of whether to keep him on life support. During challenging time, I provided pastoral care and spiritual support.   M.Kubra Susanna Kerry Resident [phone]

## 2024-02-22 NOTE — Procedures (Signed)
 Cardiopulmonary Resuscitation Note  [facility]  [id number]  20-Sep-1976  Date:February 06, 2024  Time:8:11 AM   Provider Performing:Haim Hansson E Ayden Apodaca   Procedure: Cardiopulmonary Resuscitation (92950)  Indication(s) Loss of Pulse  Consent N/A  Anesthesia N/A   Time Out N/A   Sterile Technique Hand hygiene, gloves   Procedure Description Called to patient's room for CODE BLUE. Initial rhythm was PEA/Asystole. Patient received high quality chest compressions for 20 minutes with defibrillation or cardioversion when appropriate. Epinephrine  was administered every 3 minutes as directed by time biomedical engineer. Additional pharmacologic interventions included calcium  chloride and sodium bicarbonate .  Blood was returned via CRRT. Return of spontaneous circulation was achieved.  Family at bedside.  Complications/Tolerance N/A  EBL N/A  Specimen(s) N/A  Estimated time to ROSC: 20 minutes    He remains critically ill in multisystem organ failure.Family has been updated by Dr. Gretta Ronnald Gave MSN, AGACNP-BC [facility] Amion for pager  02/06/2024, 8:14 AM

## 2024-02-22 NOTE — Code Documentation (Signed)
Sedation stopped

## 2024-02-22 NOTE — IPAL (Signed)
  Interdisciplinary Goals of Care Family Meeting   Date carried out:: 2024/02/18  Location of the meeting: Conference room  Member's involved: Physician and Family Member or next of kin  Durable Power of Attorney or environmental health practitioner: wife    Discussion: We discussed goals of care for [name] .  [name] wife, mother, and aunt have been updated during and after the code. They understand my concern about refractory metabolic acidosis and his prognosis after 3 cardiac arrests. He is on very aggressive life support and has a high chance of recurrent cardiac arrests. His wife, mother, and aunt all agree they do not want him to suffer. They want his brother and kids to see him, then want to d/c aggressive care measures and terminally extubate. While waiting on family to come we will con't current measures but he is DNR. RN, RT updated. Family now at bedside.   Code status: Full DNR  Disposition: In-patient comfort care   Time spent for the meeting: 20 min.  Jeremiah Curry 2024/02/18, 9:05 AM

## 2024-02-22 NOTE — Death Summary Note (Addendum)
 DEATH SUMMARY   Patient Details  Name: Jeremiah Curry MRN: [id number] DOB: 11/20/1976  Admission/Discharge Information   Admit Date:  February 29, 2024  Date of Death: Date of Death: 03/01/24  Time of Death: Time of Death: 1020  Length of Stay: 1  Referring Physician: Patient, No Pcp Per   Reason(s) for Hospitalization  Cardiac arrest  Diagnoses  Preliminary cause of death:  Secondary Diagnoses (including complications and co-morbidities):  Principal Problem:   Cardiac arrest [facility]) Active Problems:   AKI (acute kidney injury) (HCC)   Lactic acidosis   Encephalopathy acute   Shock (HCC)   Encounter for palliative care   DNR (do not resuscitate) PEA cardiac arrest Acute hypoxic and hypercapnic respiratory failure Hypocalcemia Hyperkalemia Hyperglycemia  Hyperphosphatemia Shock liver with hyperbilirubinemia and elevated transaminases Hypertriglyceridemia Thrombocytopenia Coagulopathy Influenza A  H1N1  Brief Hospital Course (including significant findings, care, treatment, and services provided and events leading to death)  Jeremiah Curry is a 48 y.o. year old male who  was in his usual [facility] up until few days PTA. + viral syndrome w/ cough, SOB, diarrhea (family w/ flu). Am on Feb 28, 2025 mother noted presyncopal episode and worsening weakness w/ trouble leaving rest room.  EMS called. He was hypotensive. Had witnessed seizure like episode once in truck followed by PEA arrest X 15 minutes to ROSC. Placed on epi gtt. Attempted to wean to off, had another episode of PEA arrest req'd one round ACLS.  Initial scr 5.8, K 3, bicarb 15, lactate 14. Abg 7.00/43/356/bicarb 11 PCCM asked to admit    Following admission he developed refractory lactic acidosis and metabolic acidosis as well as ongoing renal failure.  He required CRRT for management as well as a bicarbonate infusion.  He remained on broad-spectrum antibiotics, mechanical ventilatory support, vasopressors.  Despite this aggressively  support he had another cardiac arrest in the ICU.  ACLS was performed with electrolyte, bicarbonate repletion and aggressive use of vasopressors.  Vasopressors were able to be titrated down some postarrest as CRRT was reinitiated.  Due to concern for a poor prognosis and ongoing severe illness, his family transitioned him to comfort care and who passed away with family at bedside.  Pertinent Labs and Studies  Significant Diagnostic Studies DG CHEST PORT 1 VIEW Result Date: 03/01/2024 CLINICAL DATA:  Cardiac arrest.  Status post CPR. EXAM: PORTABLE CHEST 1 VIEW COMPARISON:  Earlier same day FINDINGS: Endotracheal tube tip is 8.3 cm above the base of the carina, projecting at the level of the sternal notch. Left IJ central line tip is positioned overlying the proximal SVC level. NG tube tip is in the proximal stomach with side port below the GE junction. There is new left upper lobe airspace disease. Bandlike opacity in the retrocardiac left base is probably atelectatic. No overt pulmonary edema or pleural effusion. No evidence for pneumothorax. Old posterior right rib fracture again noted. Telemetry leads overlie the chest. IMPRESSION: 1. Endotracheal tube tip is 8.3 cm above the base of the carina, projecting at the level of the sternal notch. 2. New left upper lobe airspace disease. Imaging features could be related to atelectasis, aspiration, or lung contusion. Electronically Signed   By: Camellia Candle M.D.   On: 2024/03/01 09:24   DG Chest Port 1 View Result Date: 03/01/2024 CLINICAL DATA:  Acute respiratory failure EXAM: PORTABLE CHEST 1 VIEW COMPARISON:  02/29/24 FINDINGS: Support devices are stable. Heart mediastinal contours within normal limits. No confluent airspace opacities or effusions. No acute bony abnormality.  IMPRESSION: No acute cardiopulmonary disease. Support devices stable. Electronically Signed   By: Franky Crease M.D.   On: February 12, 2024 00:23   DG CHEST PORT 1 VIEW Result Date:  01/31/2024 CLINICAL DATA:  Central line placement EXAM: PORTABLE CHEST 1 VIEW COMPARISON:  01/31/2024 FINDINGS: Two frontal views of the chest demonstrate endotracheal tube overlying tracheal air column, tip at thoracic inlet. Enteric catheter passes below diaphragm, tip and side port project over the gastric fundus. Left internal jugular catheter tip overlies superior vena cava. Cardiac silhouette is stable. No airspace disease, effusion, or pneumothorax. The pneumomediastinum seen on previous chest CT is not well visualized by x-ray. Healing right posterior sixth rib fracture again noted. No acute bony abnormalities. IMPRESSION: 1. Support devices as above. 2. No acute intrathoracic process. Pneumomediastinum seen on prior CT is not well visualized by x-ray. Electronically Signed   By: Ozell Daring M.D.   On: 01/31/2024 21:04   CT Angio Chest Pulmonary Embolism (PE) W or WO Contrast Result Date: 01/31/2024 CLINICAL DATA:  Cardiac arrest. CPR. Pulmonary embolism (PE) suspected, high prob EXAM: CT ANGIOGRAPHY CHEST WITH CONTRAST TECHNIQUE: Multidetector CT imaging of the chest was performed using the standard protocol during bolus administration of intravenous contrast. Multiplanar CT image reconstructions and MIPs were obtained to evaluate the vascular anatomy. RADIATION DOSE REDUCTION: This exam was performed according to the departmental dose-optimization program which includes automated exposure control, adjustment of the mA and/or kV according to patient size and/or use of iterative reconstruction technique. CONTRAST:  65mL OMNIPAQUE  IOHEXOL  350 MG/ML SOLN COMPARISON:  Chest radiograph from earlier today. FINDINGS: Cardiovascular: The study is high quality for the evaluation of pulmonary embolism. There are no filling defects in the central, lobar, segmental or subsegmental pulmonary artery branches to suggest acute pulmonary embolism. Atherosclerotic nonaneurysmal thoracic aorta. Normal caliber pulmonary  arteries. Normal heart size. No significant pericardial fluid/thickening. Left anterior descending and left circumflex coronary atherosclerosis. Mediastinum/Nodes: No significant thyroid nodules. Enteric tube terminates in the gastric fundus. Small fluid level in the lower thoracic esophagus, which is otherwise normal. No pathologically enlarged axillary, mediastinal or hilar lymph nodes. Small amount of pneumomediastinum in the anterior mediastinum surrounding aortic arch and AP window region. No mediastinal fluid collections. Lungs/Pleura: No pneumothorax. No pleural effusion. Endotracheal tube tip is 6.8 cm above the carina. Mild-to-moderate streaky atelectasis in the posterior lower lobes. No acute consolidative airspace disease, lung masses or significant pulmonary nodules. Upper abdomen: No acute abnormality. Musculoskeletal:  No aggressive appearing focal osseous lesions. Review of the MIP images confirms the above findings. IMPRESSION: 1. No pulmonary embolism. 2. Mild-to-moderate dependent lower lobe atelectasis. 3. Small amount of pneumomediastinum in the anterior mediastinum, suspected to be secondary to barotrauma from CPR. No mediastinal fluid collections. No esophageal pneumatosis. 4. Well-positioned endotracheal and enteric tubes. 5. Two-vessel coronary atherosclerosis. 6.  Aortic Atherosclerosis (ICD10-I70.0). Electronically Signed   By: Selinda DELENA Blue M.D.   On: 01/31/2024 18:40   CT HEAD WO CONTRAST Result Date: 01/31/2024 CLINICAL DATA:  Found unresponsive, cardiac arrest EXAM: CT HEAD WITHOUT CONTRAST TECHNIQUE: Contiguous axial images were obtained from the base of the skull through the vertex without intravenous contrast. RADIATION DOSE REDUCTION: This exam was performed according to the departmental dose-optimization program which includes automated exposure control, adjustment of the mA and/or kV according to patient size and/or use of iterative reconstruction technique. COMPARISON:  None  Available. FINDINGS: Brain: No evidence of acute infarction, hemorrhage, mass, mass effect, or midline shift. No hydrocephalus or extra-axial fluid collection.  Gray-white differentiation is preserved. Vascular: No hyperdense vessel. Skull: Negative for fracture or focal lesion. Sinuses/Orbits: No acute finding. Other: The mastoid air cells are well aerated. Fluid in the nasopharynx, related to intubation. IMPRESSION: No acute intracranial process. These findings were discussed by telephone on 01/31/2024 at 6:14 pm with provider AGANWANLA. Electronically Signed   By: Donald Campion M.D.   On: 01/31/2024 18:15   DG Chest Portable 1 View Result Date: 01/31/2024 CLINICAL DATA:  Intubated. EXAM: PORTABLE CHEST 1 VIEW COMPARISON:  Earlier today. FINDINGS: Endotracheal tube in satisfactory position. Orogastric tube extending into the stomach. Borderline enlarged cardiac silhouette. Small amount of patchy density in the right mid lung zone the remainder of the lungs are clear. Unremarkable bones. IMPRESSION: 1. Satisfactory position of the endotracheal and orogastric tubes. 2. Small amount of patchy density in the right mid lung zone, which could represent small amount of pneumonia or a subsolid lung nodule. Followup PA and lateral chest X-ray is recommended in 3-4 weeks following trial of antibiotic therapy to ensure resolution and exclude underlying malignancy. Electronically Signed   By: Elspeth Bathe M.D.   On: 01/31/2024 17:50   DG Abd Portable 1V Result Date: 01/31/2024 CLINICAL DATA:  Orogastric tube placement. EXAM: PORTABLE ABDOMEN - 1 VIEW COMPARISON:  None Available. FINDINGS: Orogastric tube tip and side hole in the proximal stomach. Partially included mildly prominent gas-filled bowel loops. Normal caliber stomach mild lumbar and lower thoracic spine degenerative changes. IMPRESSION: Orogastric tube tip and side hole in the proximal stomach. Electronically Signed   By: Elspeth Bathe M.D.   On: 01/31/2024 17:46    DG Chest Port 1 View Result Date: 01/31/2024 CLINICAL DATA:  Endotracheal tube EXAM: PORTABLE CHEST 1 VIEW COMPARISON:  Chest x-ray 02/13/2013 FINDINGS: Endotracheal tube tip is high measuring 10 cm above the carina. Enteric tube extends below the diaphragm, but distal tip can not be confirmed. Lines and tubes overlie the chest. The cardiomediastinal silhouette is within normal limits. There is a healing posterior right sixth rib fracture. Lungs are costophrenic angles are clear. No pneumothorax. IMPRESSION: 1. Endotracheal tube tip is high measuring 10 cm above the carina. Recommend advancing tube 5 cm. 2. No acute cardiopulmonary process. Electronically Signed   By: Greig Pique M.D.   On: 01/31/2024 17:44    Microbiology Recent Results (from the past 240 hours)  Culture, blood (Routine X 2) w Reflex to ID Panel     Status: None (Preliminary result)   Collection Time: 01/31/24  5:29 PM   Specimen: BLOOD  Result Value Ref Range Status   Specimen Description BLOOD RIGHT ANTECUBITAL  Final   Special Requests   Final    BOTTLES DRAWN AEROBIC ONLY Blood Culture adequate volume   Culture   Final    NO GROWTH < 24 HOURS Performed at [facility], [address] [place], [place] [address]    Report Status PENDING  Incomplete  Respiratory (~20 pathogens) panel by PCR     Status: Abnormal   Collection Time: 01/31/24  8:49 PM   Specimen: Nasopharyngeal Swab; Respiratory  Result Value Ref Range Status   Adenovirus NOT DETECTED NOT DETECTED Final   Coronavirus 229E NOT DETECTED NOT DETECTED Final    Comment: (NOTE) The Coronavirus on the Respiratory Panel, DOES NOT test for the novel  Coronavirus (2019 nCoV)    Coronavirus HKU1 NOT DETECTED NOT DETECTED Final   Coronavirus NL63 NOT DETECTED NOT DETECTED Final   Coronavirus OC43 NOT DETECTED  NOT DETECTED Final   Metapneumovirus NOT DETECTED NOT DETECTED Final   Rhinovirus / Enterovirus NOT DETECTED NOT DETECTED Final   Influenza A H1  2009 DETECTED (A) NOT DETECTED Final   Influenza B NOT DETECTED NOT DETECTED Final   Parainfluenza Virus 1 NOT DETECTED NOT DETECTED Final   Parainfluenza Virus 2 NOT DETECTED NOT DETECTED Final   Parainfluenza Virus 3 NOT DETECTED NOT DETECTED Final   Parainfluenza Virus 4 NOT DETECTED NOT DETECTED Final   Respiratory Syncytial Virus NOT DETECTED NOT DETECTED Final   Bordetella pertussis NOT DETECTED NOT DETECTED Final   Bordetella Parapertussis NOT DETECTED NOT DETECTED Final   Chlamydophila pneumoniae NOT DETECTED NOT DETECTED Final   Mycoplasma pneumoniae NOT DETECTED NOT DETECTED Final    Comment: Performed at [facility], [address], [place], [place] [address]  SARS Coronavirus 2 by RT PCR (hospital order, performed in [facility] hospital lab) *cepheid single result test* Anterior Nasal Swab     Status: None   Collection Time: 01/31/24  8:49 PM   Specimen: Anterior Nasal Swab  Result Value Ref Range Status   SARS Coronavirus 2 by RT PCR NEGATIVE NEGATIVE Final    Comment: Performed at [facility], [address], [place], [place] [address]  MRSA Next Gen by PCR, Nasal     Status: None   Collection Time: 02/03/24  2:35 AM   Specimen: Nasal Mucosa; Nasal Swab  Result Value Ref Range Status   MRSA by PCR Next Gen NOT DETECTED NOT DETECTED Final    Comment: (NOTE) The GeneXpert MRSA Assay (FDA approved for NASAL specimens only), is one component of a comprehensive MRSA colonization surveillance program. It is not intended to diagnose MRSA infection nor to guide or monitor treatment for MRSA infections. Test performance is not FDA approved in patients less than 42 years old. Performed at [facility], [address], [place], [place] [address]     Lab Basic Metabolic Panel: Recent Labs  Lab 01/31/24 1648 01/31/24 1726 01/31/24 1857 01/31/24 1858 01/31/24 2223 01/31/24 2345 02-03-24 0330 02-03-24 0418 02-03-24 0743 02-03-2024 0819  NA 137   < >  139   < > 141 137 137 135 133* 141  K 3.0*   < > 2.8*   < > 3.0* 3.2* 3.7 3.8 5.3* 4.1  CL 103  --  95*  --  99  --  96*  --   --   --   CO2  --   --  14*  --  11*  --  14*  --   --   --   GLUCOSE 88  --  74  --  142*  --  143*  --   --   --   BUN 62*  --  43*  --  44*  --  35*  --   --   --   CREATININE 5.80*  --  5.05*  --  5.12*  --  4.06*  --   --   --   CALCIUM   --   --  6.8*  --  6.4*  --  6.6*  --   --   --   MG  --   --  2.2  --   --   --  1.8  --   --   --   PHOS  --   --  >30.0*  --   --   --  7.4*  --   --   --    < > =  values in this interval not displayed.   Liver Function Tests: Recent Labs  Lab 01/31/24 1857 02/21/2024 0330  AST 2,056* 6,824*  ALT 936* 1,210*  ALKPHOS 98 107  BILITOT 2.3* 2.5*  PROT 6.1* 6.0*  ALBUMIN 2.0* 2.0*   No results for input(s): LIPASE, AMYLASE in the last 168 hours. No results for input(s): AMMONIA in the last 168 hours. CBC: Recent Labs  Lab 01/31/24 1753 01/31/24 1858 01/31/24 2223 01/31/24 2345 02/21/2024 0330 February 21, 2024 0418 February 21, 2024 0743 Feb 21, 2024 0819  WBC 15.8*  --  7.6  --  8.1  --   --   --   HGB 13.4   < > 13.0 13.3 13.0 13.3 14.6 11.9*  HCT 40.3   < > 37.4* 39.0 36.5* 39.0 43.0 35.0*  MCV 109.8*  --  103.3*  --  100.3*  --   --   --   PLT 11*  --  38*  38*  --  28*  --   --   --    < > = values in this interval not displayed.   Cardiac Enzymes: No results for input(s): CKTOTAL, CKMB, CKMBINDEX, TROPONINI in the last 168 hours. Sepsis Labs: Recent Labs  Lab 01/31/24 1753 01/31/24 1857 01/31/24 1908 01/31/24 2223 01/31/24 2244 02-21-2024 0330 02-21-24 0335 02-21-24 0729  PROCALCITON  --  6.55  --   --   --   --   --   --   WBC 15.8*  --   --  7.6  --  8.1  --   --   LATICACIDVEN  --   --  13.0*  --  13.6*  --  >9.0* >9.0*    Procedures/Operations  HD catheter CVC A-line   Leita SHAUNNA Gaskins 2024-02-21, 8:32 PM

## 2024-02-22 NOTE — Progress Notes (Signed)
   10-Feb-2024 1115  Spiritual Encounters  Type of Visit Initial  Care provided to: [facility] partners present during Programmer, Systems;Other (comment)  Referral source Clinical staff  Reason for visit End-of-life  OnCall Visit Yes   Chaplain responded to visit a patient who is actively dying. Chaplain met with patient's family. Family requested I reach out to [facility] to try and pass on a message to a family member who is detained there. Chaplain attempted this - including connecting with staff at the [facility] and the [place] [employer] in the ER lobby. Was unsuccessful - Cannot reach [employer] on the weekend and chaplain is not working on the weekend. Informed family of this.  Chaplain introduced spiritual care services. Spiritual care services available as needed.  Juliene CHRISTELLA Das, Chaplain 2024/02/10

## 2024-02-22 NOTE — Plan of Care (Signed)
 Notified that family is ready to transition to comfort care, as planned per discussions w Dr. Gretta and family  Orders placed for comfort focussed EOL care -- fent gtt PRN BZD, dc labs imaging, compassionate extubation., dc pressors Will dc CRRT orders as well   Expect in-hospital death, likely minutes possibly hours.   Ronnald Gave MSN, AGACNP-BC [facility] Pulmonary/Critical Care Medicine February 13, 2024, 9:41 AM

## 2024-02-22 DEATH — deceased
